# Patient Record
Sex: Female | Born: 1980 | Race: White | Hispanic: No | Smoking: Current every day smoker
Health system: Southern US, Community
[De-identification: ages and names within clinical notes are randomized; demographics above are authoritative.]

---

## 2000-11-08 ENCOUNTER — Other Ambulatory Visit: Admission: RE | Admit: 2000-11-08 | Discharge: 2000-11-08 | Payer: Self-pay

## 2001-03-21 ENCOUNTER — Other Ambulatory Visit: Admission: RE | Admit: 2001-03-21 | Discharge: 2001-03-21 | Payer: Self-pay

## 2002-09-08 ENCOUNTER — Emergency Department (HOSPITAL_COMMUNITY): Admission: EM | Admit: 2002-09-08 | Discharge: 2002-09-08 | Payer: Self-pay | Admitting: Emergency Medicine

## 2002-09-10 ENCOUNTER — Ambulatory Visit (HOSPITAL_COMMUNITY): Admission: RE | Admit: 2002-09-10 | Discharge: 2002-09-10 | Payer: Self-pay | Admitting: Emergency Medicine

## 2002-09-10 ENCOUNTER — Encounter: Payer: Self-pay | Admitting: Emergency Medicine

## 2003-04-04 ENCOUNTER — Emergency Department (HOSPITAL_COMMUNITY): Admission: EM | Admit: 2003-04-04 | Discharge: 2003-04-04 | Payer: Self-pay | Admitting: Emergency Medicine

## 2004-12-17 ENCOUNTER — Emergency Department (HOSPITAL_COMMUNITY): Admission: EM | Admit: 2004-12-17 | Discharge: 2004-12-17 | Payer: Self-pay | Admitting: Emergency Medicine

## 2005-01-29 ENCOUNTER — Ambulatory Visit (HOSPITAL_COMMUNITY): Admission: RE | Admit: 2005-01-29 | Discharge: 2005-01-29 | Payer: Self-pay | Admitting: *Deleted

## 2005-03-15 ENCOUNTER — Ambulatory Visit (HOSPITAL_COMMUNITY): Admission: RE | Admit: 2005-03-15 | Discharge: 2005-03-15 | Payer: Self-pay | Admitting: *Deleted

## 2005-04-15 ENCOUNTER — Ambulatory Visit (HOSPITAL_COMMUNITY): Admission: RE | Admit: 2005-04-15 | Discharge: 2005-04-15 | Payer: Self-pay | Admitting: *Deleted

## 2005-04-23 ENCOUNTER — Ambulatory Visit (HOSPITAL_COMMUNITY): Admission: RE | Admit: 2005-04-23 | Discharge: 2005-04-23 | Payer: Self-pay | Admitting: *Deleted

## 2010-02-08 ENCOUNTER — Encounter: Payer: Self-pay | Admitting: *Deleted

## 2010-02-08 ENCOUNTER — Encounter: Payer: Self-pay | Admitting: Preventative Medicine

## 2012-07-11 ENCOUNTER — Encounter: Payer: Self-pay | Admitting: Cardiovascular Disease

## 2012-07-26 ENCOUNTER — Ambulatory Visit (HOSPITAL_COMMUNITY)
Admission: RE | Admit: 2012-07-26 | Discharge: 2012-07-26 | Disposition: A | Discharge status: Home / Self Care | Payer: Medicare Other | Source: Ambulatory Visit | Attending: Cardiovascular Disease | Admitting: Cardiovascular Disease

## 2012-07-26 DIAGNOSIS — G459 Transient cerebral ischemic attack, unspecified: Secondary | ICD-10-CM | POA: Insufficient documentation

## 2012-07-26 NOTE — Progress Notes (Signed)
*  PRELIMINARY RESULTS* Echocardiogram 2D Echo limited Bubble study for PFO has been performed.  Conrad Whittier 07/26/2012, 1:23 PM

## 2014-08-14 ENCOUNTER — Encounter: Payer: Self-pay | Admitting: Cardiovascular Disease

## 2016-06-05 ENCOUNTER — Encounter (HOSPITAL_COMMUNITY): Payer: Self-pay | Admitting: *Deleted

## 2016-06-05 ENCOUNTER — Emergency Department (HOSPITAL_COMMUNITY)
Admission: EM | Admit: 2016-06-05 | Discharge: 2016-06-05 | Disposition: A | Discharge status: Home / Self Care | Payer: Medicare Other | Attending: Emergency Medicine | Admitting: Emergency Medicine

## 2016-06-05 ENCOUNTER — Emergency Department (HOSPITAL_COMMUNITY): Payer: Medicare Other

## 2016-06-05 DIAGNOSIS — W19XXXA Unspecified fall, initial encounter: Secondary | ICD-10-CM

## 2016-06-05 DIAGNOSIS — Y9389 Activity, other specified: Secondary | ICD-10-CM | POA: Insufficient documentation

## 2016-06-05 DIAGNOSIS — F17210 Nicotine dependence, cigarettes, uncomplicated: Secondary | ICD-10-CM | POA: Insufficient documentation

## 2016-06-05 DIAGNOSIS — S6992XA Unspecified injury of left wrist, hand and finger(s), initial encounter: Secondary | ICD-10-CM | POA: Diagnosis present

## 2016-06-05 DIAGNOSIS — S52572A Other intraarticular fracture of lower end of left radius, initial encounter for closed fracture: Secondary | ICD-10-CM | POA: Diagnosis not present

## 2016-06-05 DIAGNOSIS — W1839XA Other fall on same level, initial encounter: Secondary | ICD-10-CM | POA: Insufficient documentation

## 2016-06-05 DIAGNOSIS — Y999 Unspecified external cause status: Secondary | ICD-10-CM | POA: Insufficient documentation

## 2016-06-05 DIAGNOSIS — Y929 Unspecified place or not applicable: Secondary | ICD-10-CM | POA: Diagnosis not present

## 2016-06-05 MED ORDER — OXYCODONE-ACETAMINOPHEN 5-325 MG PO TABS: 1.0000 | ORAL_TABLET | ORAL | 0 refills | Status: DC | PRN

## 2016-06-05 MED ORDER — IBUPROFEN 800 MG PO TABS: 800.0000 mg | ORAL_TABLET | Freq: Three times a day (TID) | ORAL | 0 refills | Status: DC | PRN

## 2016-06-05 MED ORDER — OXYCODONE-ACETAMINOPHEN 5-325 MG PO TABS
1.0000 | ORAL_TABLET | Freq: Once | ORAL | Status: AC
  Administered 2016-06-05: 1 via ORAL
  Filled 2016-06-05: qty 1

## 2016-06-05 NOTE — ED Notes (Signed)
Pt discharged from ED AVS and prescriptions reviewed with verbalized understandings.  Pt left with mother.

## 2016-06-05 NOTE — ED Notes (Signed)
Sugar tong splint applied to L forearm, pt tolerated well.  Pt educated on importance of vascular assessment to finger tips.  Pt able to teach back and instructed on how to remove splint if circulation compromised.

## 2016-06-05 NOTE — ED Triage Notes (Signed)
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

## 2016-06-05 NOTE — ED Provider Notes (Signed)
Emergency Department Provider Note  By signing my name below, I, Levon HedgerElizabeth Hall, attest that this documentation has been prepared under the direction and in the presence of Hanh Kertesz, Arlyss RepressJoshua G, MD . Electronically Signed: Levon HedgerElizabeth Hall, Scribe. 06/05/2016. 9:01 PM.   I have reviewed the triage vital signs and the nursing notes.  HISTORY  Chief Complaint Fall  HPI Patricia Maxwell is a left hand dominant 36 y.o. female who presents to the Emergency Department complaining of sudden onset, constant pain to her left wrist s/p mechanical fall today PTA. 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 She describes her left wrist pain as 10/10, sharp, throbbing pain that is exacerbated by movement. She reports associated swelling to the area. No OTC treatments tried for these symptoms PTA.  She denies any head injury, LOC, pain to left shoulder or elbow, weakness, or numbness.   History reviewed. No pertinent past medical history.  There are no active problems to display for this patient.   History reviewed. No pertinent surgical history.   Allergies Codeine and Red dye  Family History  Problem Relation Age of Onset  . Stroke Father   . Cancer Maternal Grandmother   . Heart disease Paternal Grandmother   . Cancer Paternal Grandfather   . Heart disease Maternal Grandfather     Social History Social History  Substance Use Topics  . Smoking status: Current Every Day Smoker    Packs/day: 0.50    Years: 15.00    Types: Cigarettes  . Smokeless tobacco: Never Used  . Alcohol use No    Review of Systems  Constitutional: No fever/chills Eyes: No visual changes. ENT: No sore throat. Cardiovascular: Denies chest pain. Respiratory: Denies shortness of breath. Gastrointestinal: No abdominal pain.  No nausea, no vomiting.  No diarrhea.  No constipation. Genitourinary: Negative for dysuria. Musculoskeletal: Positive for left wrist pain.  Negative for back pain. Skin: Negative for rash. Neurological: Negative for headaches, focal weakness or numbness.  10-point ROS otherwise negative.  ____________________________________________   PHYSICAL EXAM:  VITAL SIGNS: ED Triage Vitals  Enc Vitals Group     BP 06/05/16 2014 (!) 122/96     Pulse Rate 06/05/16 2014 71     Resp 06/05/16 2014 20     Temp 06/05/16 2014 98.2 F (36.8 C)     Temp Source 06/05/16 2014 Oral     SpO2 06/05/16 2014 100 %     Weight 06/05/16 2014 151 lb 5 oz (68.6 kg)     Height 06/05/16 2014 5\' 3"  (1.6 m)     Pain Score 06/05/16 2013 10   Constitutional: Alert and oriented. Well appearing and in no acute distress. Eyes: Conjunctivae are normal.  Head: Atraumatic. Nose: No congestion/rhinnorhea. Mouth/Throat: Mucous membranes are moist.  Oropharynx non-erythematous. Neck: No stridor.  Cardiovascular: Normal rate, regular rhythm. Good peripheral circulation. Grossly normal heart sounds.   Respiratory: Normal respiratory effort.  No retractions. Lungs CTAB. Gastrointestinal: Soft and nontender. No distention.  Musculoskeletal: No lower extremity tenderness nor edema. Tender left wrist with mild swelling. No overlying abrasion or laceration. Normal radial pulse and sensation in the left hand/forearm.  Neurologic:  Normal speech and language. No gross focal neurologic deficits are appreciated.  Skin:  Skin is warm, dry and intact. No rash noted.  ____________________________________________  RADIOLOGY  Dg Wrist Complete Left  Result Date: 06/05/2016 CLINICAL DATA:  Fall, left wrist pain EXAM: LEFT WRIST -  COMPLETE 3+ VIEW COMPARISON:  None. FINDINGS: Comminuted, intra-articular distal radial fracture, nondisplaced. Associated mild soft tissue swelling. IMPRESSION: Intra-articular distal radial fracture, as above. Electronically Signed   By: Charline Bills M.D.   On: 06/05/2016 20:49     ____________________________________________   PROCEDURES  Procedure(s) performed:   .Splint Application Date/Time: 06/06/2016 7:22 AM Performed by: Maia Plan Authorized by: Maia Plan   Consent:    Consent obtained:  Verbal   Consent given by:  Patient   Risks discussed:  Discoloration, numbness, pain and swelling   Alternatives discussed:  Alternative treatment Pre-procedure details:    Sensation:  Normal   Skin color:  Normal Procedure details:    Laterality:  Left   Location:  Wrist   Wrist:  L wrist   Strapping: no     Splint type:  Sugar tong   Supplies:  Plaster Post-procedure details:    Pain:  Improved   Sensation:  Normal   Skin color:  Normal   Patient tolerance of procedure:  Tolerated well, no immediate complications    ____________________________________________   INITIAL IMPRESSION / ASSESSMENT AND PLAN / ED COURSE  Pertinent labs & imaging results that were available during my care of the patient were reviewed by me and considered in my medical decision making (see chart for details).  Patient presents to the ED for evaluation of left wrist pain after FOOSH injury mechanism while skating. Patient with a non-displaced intra-articular distal radial fracture. Splinted as above. Spoke with Dr. Romeo Apple who will see the patient in the office for further f/u. Discussed splint care at home and importance of should ROM to prevent frozen shoulder. Provided pain medication and NSAIDs.  At this time, I do not feel there is any life-threatening condition present. I have reviewed and discussed all results (EKG, imaging, lab, urine as appropriate), exam findings with patient. I have reviewed nursing notes and appropriate previous records.  I feel the patient is safe to be discharged home without further emergent workup. Discussed usual and customary return precautions. Patient and family (if present) verbalize understanding and are comfortable with this  plan.  Patient will follow-up with their primary care provider. If they do not have a primary care provider, information for follow-up has been provided to them. All questions have been answered.  ____________________________________________  FINAL CLINICAL IMPRESSION(S) / ED DIAGNOSES  Final diagnoses:  Fall, initial encounter  Other closed intra-articular fracture of distal end of left radius, initial encounter     MEDICATIONS GIVEN DURING THIS VISIT:  Medications  oxyCODONE-acetaminophen (PERCOCET/ROXICET) 5-325 MG per tablet 1 tablet (1 tablet Oral Given 06/05/16 2112)     NEW OUTPATIENT MEDICATIONS STARTED DURING THIS VISIT:  Discharge Medication List as of 06/05/2016  9:20 PM    START taking these medications   Details  ibuprofen (ADVIL,MOTRIN) 800 MG tablet Take 1 tablet (800 mg total) by mouth every 8 (eight) hours as needed., Starting Sat 06/05/2016, Print    oxyCODONE-acetaminophen (PERCOCET/ROXICET) 5-325 MG tablet Take 1 tablet by mouth every 4 (four) hours as needed for severe pain., Starting Sat 06/05/2016, Print       I personally performed the services described in this documentation, which was scribed in my presence. The recorded information has been reviewed and is accurate.    Note:  This document was prepared using Dragon voice recognition software and may include unintentional dictation errors.  Alona Bene, MD Emergency Medicine    Blossom Crume, Arlyss Repress, MD 06/06/16 [phone]

## 2016-06-05 NOTE — Discharge Instructions (Signed)
You were seen today with a wrist fracture. We are placing you in a splint which you should keep on and dry. Call the Orthopedic Physician Dr. Romeo AppleHarrison on Monday to schedule a follow up appointment. They will talk with you about management at that time. Take the Percocet only as needed for severe pain. Take Motrin and Tylenol for pain as well. The Percocet does contain some Tylenol so make sure that you do not take more than 4,000 mg in a 24 hour period.   Return to the ED with any additional injury, worsening pain, or numbness.

## 2016-06-07 ENCOUNTER — Ambulatory Visit (INDEPENDENT_AMBULATORY_CARE_PROVIDER_SITE_OTHER): Payer: Medicare Other | Admitting: Orthopedic Surgery

## 2016-06-07 ENCOUNTER — Encounter: Payer: Self-pay | Admitting: Orthopedic Surgery

## 2016-06-07 VITALS — BP 113/60 | HR 85 | Wt 153.0 lb

## 2016-06-07 DIAGNOSIS — S52532A Colles' fracture of left radius, initial encounter for closed fracture: Secondary | ICD-10-CM

## 2016-06-07 NOTE — Progress Notes (Signed)
  NEW PATIENT OFFICE VISIT    Chief Complaint  Patient presents with  . Wrist Injury    LEFT WRIST FRACTURE, DOI 06/05/16    36 year old female who is a smoker presents with no family history of osteoporosis but a 2 day history of new onset sharp pain over the left wrist after falling. She presented to the ER with pain swelling x-ray shows a distal radius fracture comminuted but nondisplaced. Injury was sustained skating    Review of Systems  Constitutional: Negative for chills, fever and weight loss.  Respiratory: Negative for shortness of breath.   Cardiovascular: Negative for chest pain.  Neurological: Negative for tingling.   Patient does not have hypertension or diabetes and she has never had surgery  Family History  Problem Relation Age of Onset  . Stroke Father   . Cancer Maternal Grandmother   . Heart disease Paternal Grandmother   . Cancer Paternal Grandfather   . Heart disease Maternal Grandfather    Social History  Substance Use Topics  . Smoking status: Current Every Day Smoker    Packs/day: 0.50    Years: 15.00    Types: Cigarettes  . Smokeless tobacco: Never Used  . Alcohol use No    BP 113/60   Pulse 85   Wt 153 lb (69.4 kg)   LMP 05/16/2016   BMI 27.10 kg/m   Physical Exam  Constitutional: She is oriented to person, place, and time. She appears well-developed and well-nourished. No distress.  Cardiovascular: Normal rate and intact distal pulses.   Neurological: She is alert and oriented to person, place, and time. She has normal reflexes. She exhibits normal muscle tone. Coordination normal.  Skin: Skin is warm and dry. No rash noted. She is not diaphoretic. No erythema. No pallor.  Psychiatric: She has a normal mood and affect. Her behavior is normal. Judgment and thought content normal.    Ortho Exam   Right upper extremity normal range of motion stability strength neurovascular exam intact skin normal no tenderness or malalignment  Left  distal radius tenderness swelling decreased range of motion stability deferred because of pain muscle tone normal elbow and shoulder normal range of motion no tenderness including the humerus and forearm elbow nontender  Neurovascular exam intact  Skin intact  Meds ordered this encounter  Medications  . citalopram (CELEXA) 20 MG tablet  . fluticasone (FLONASE) 50 MCG/ACT nasal spray  . omeprazole (PRILOSEC) 20 MG capsule    Encounter Diagnosis  Name Primary?  . Closed Colles' fracture of left radius, initial encounter Yes    X-ray shows a distal radius fracture slight dorsal tilt minimal displacement dorsally. Radial inclination slightly decreased but ulna is still shorter than the radius. Most the comminution is in the metaphyseal area there is intra-articular extension into the lunate fossa PLAN:    I believe this can be treated nonoperatively with a cast  X-ray 4 weeks out of plaster  Short arm cast applied

## 2016-07-05 ENCOUNTER — Ambulatory Visit (INDEPENDENT_AMBULATORY_CARE_PROVIDER_SITE_OTHER): Payer: Medicare Other | Admitting: Orthopedic Surgery

## 2016-07-05 ENCOUNTER — Encounter: Payer: Self-pay | Admitting: Orthopedic Surgery

## 2016-07-05 ENCOUNTER — Ambulatory Visit (INDEPENDENT_AMBULATORY_CARE_PROVIDER_SITE_OTHER): Payer: Medicare Other

## 2016-07-05 DIAGNOSIS — S52532D Colles' fracture of left radius, subsequent encounter for closed fracture with routine healing: Secondary | ICD-10-CM

## 2016-07-05 NOTE — Progress Notes (Signed)
Patient ID: Patricia Maxwell, female   DOB: 07/09/1980, 36 y.o.   MRN: [id number]  Chief Complaint  Patient presents with  . Follow-up    LEFT WRIST FRACTURE, DOI 06/05/16    28 days post fracture left wrist treated with cast  Here for four-week x-ray  36 year old female who is a smoker presents with no family history of osteoporosis but a 2 day history of new onset sharp pain over the left wrist after falling. She presented to the ER with pain swelling x-ray shows a distal radius fracture comminuted but nondisplaced. Injury was sustained skating    ROS    LMP 06/14/2016 (Approximate)  Gen. appearance is normal grooming and hygiene normal Orientation to person place and time normal Mood normal   Ortho Exam Overall clinical alignment of the left wrist is normal there is some tenderness swelling and painful wrist motion  Her x-ray shows maintenance of position of the intra-articular fracture with slight radial inclination loss slight height loss volar tilt is about 3 dorsal  Reapplication short arm cast  A/P  Medical decision-making  Encounter Diagnosis  Name Primary?  . Closed Colles' fracture of left radius with routine healing, subsequent encounter Yes      X-ray out of plaster 4 weeks, left wrist   Patricia CanadaStanley Harrison, MD 07/05/2016 2:18 PM

## 2016-08-02 ENCOUNTER — Ambulatory Visit (INDEPENDENT_AMBULATORY_CARE_PROVIDER_SITE_OTHER): Payer: Self-pay | Admitting: Orthopedic Surgery

## 2016-08-02 ENCOUNTER — Encounter: Payer: Self-pay | Admitting: Orthopedic Surgery

## 2016-08-02 ENCOUNTER — Ambulatory Visit (INDEPENDENT_AMBULATORY_CARE_PROVIDER_SITE_OTHER): Payer: Medicare Other

## 2016-08-02 DIAGNOSIS — S52532D Colles' fracture of left radius, subsequent encounter for closed fracture with routine healing: Secondary | ICD-10-CM | POA: Diagnosis not present

## 2016-08-02 NOTE — Progress Notes (Signed)
Fracture care follow-up visit  Chief Complaint  Patient presents with  . Follow-up    left colles fracture, DOI 06/05/16    Post injury day #58  Initial treatment day #55  Left distal radius fracture treated with cast immobilization  Her skin is intact is a little dry from the casting she can move all her fingers including her thumb  She has some soreness at the wrist joint  The x-ray shows no shortening some slight dorsal tilting an intra-articular extension  Recommend fracture brace with 3 times a day exercises which I showed her how to do  Combat four-week check range of motion  Encounter Diagnosis  Name Primary?  . Closed Colles' fracture of left radius with routine healing, subsequent encounter Yes

## 2016-08-02 NOTE — Patient Instructions (Signed)
3 times a day remove the brace and do the exercises as instructed

## 2016-08-30 ENCOUNTER — Ambulatory Visit (INDEPENDENT_AMBULATORY_CARE_PROVIDER_SITE_OTHER): Payer: Medicare Other | Admitting: Orthopedic Surgery

## 2016-08-30 DIAGNOSIS — S52532D Colles' fracture of left radius, subsequent encounter for closed fracture with routine healing: Secondary | ICD-10-CM

## 2016-08-30 NOTE — Progress Notes (Signed)
Chief Complaint  Patient presents with  . Fracture    Left wrist Date of injury 06/07/16 approx 10 weeks s/p    She's here for recheck after her left distal radius fracture which was treated with cast immobilization  She does complain of an ulnar prominence  Her last x-ray was taken July 16  She did lose some radial length and angulation and for the most part neutral tilt with lunate fossa depression  She has no tenderness now she has some wrist stiffness primarily flexion. Pronation supination is pretty good  Recommend resume normal activities and remove brace Encounter Diagnosis  Name Primary?  . Closed Colles' fracture of left radius with routine healing, subsequent encounter Yes

## 2018-07-19 ENCOUNTER — Other Ambulatory Visit: Payer: Self-pay

## 2018-07-19 ENCOUNTER — Other Ambulatory Visit: Payer: Medicare Other

## 2018-07-19 DIAGNOSIS — Z20822 Contact with and (suspected) exposure to covid-19: Secondary | ICD-10-CM

## 2018-07-24 LAB — NOVEL CORONAVIRUS, NAA: SARS-CoV-2, NAA: NOT DETECTED

## 2018-09-22 ENCOUNTER — Emergency Department (HOSPITAL_COMMUNITY)
Admission: EM | Admit: 2018-09-22 | Discharge: 2018-09-22 | Disposition: A | Discharge status: Home / Self Care | Payer: Medicare Other | Attending: Emergency Medicine | Admitting: Emergency Medicine

## 2018-09-22 ENCOUNTER — Other Ambulatory Visit: Payer: Self-pay

## 2018-09-22 ENCOUNTER — Emergency Department (HOSPITAL_COMMUNITY): Payer: Medicare Other

## 2018-09-22 ENCOUNTER — Encounter (HOSPITAL_COMMUNITY): Payer: Self-pay

## 2018-09-22 DIAGNOSIS — Z79899 Other long term (current) drug therapy: Secondary | ICD-10-CM | POA: Diagnosis not present

## 2018-09-22 DIAGNOSIS — F17210 Nicotine dependence, cigarettes, uncomplicated: Secondary | ICD-10-CM | POA: Insufficient documentation

## 2018-09-22 DIAGNOSIS — Z20828 Contact with and (suspected) exposure to other viral communicable diseases: Secondary | ICD-10-CM | POA: Diagnosis not present

## 2018-09-22 DIAGNOSIS — J069 Acute upper respiratory infection, unspecified: Secondary | ICD-10-CM | POA: Insufficient documentation

## 2018-09-22 DIAGNOSIS — R05 Cough: Secondary | ICD-10-CM | POA: Diagnosis present

## 2018-09-22 MED ORDER — BENZONATATE 100 MG PO CAPS: 100.0000 mg | ORAL_CAPSULE | Freq: Three times a day (TID) | ORAL | 0 refills | Status: DC

## 2018-09-22 NOTE — ED Provider Notes (Signed)
[facility]ENCY DEPARTMENT Provider Note   CSN: [id number] Arrival date & time: 09/22/18  1443     History   Chief Complaint Chief Complaint  Patient presents with  . Cough    HPI Patricia Maxwell is a 38 y.o. female who presents for evaluation of cough that began yesterday.  She reports cough is productive of yellow mucus.  She has not noted any hemoptysis.  She she measured a temperature of 99.9.  She has not had any runny nose or congestion.  She states that she feels achy all over.  She does report that when she gets into a coughing fit, she has trouble catching her breath after.  She also reports some chest soreness with coughing but otherwise does not any chest pain.  She states that she has not taken any medications for her symptoms.  She does not have any history of COPD or asthma.  She does report that she smokes cigarettes.  She denies any travel or known COVID-19 exposure. She denies any OCP use, recent immobilization, prior history of DVT/PE, recent surgery, leg swelling, or long travel.  Patient denies any abdominal pain, nausea/vomiting.     The history is provided by the patient.    History reviewed. No pertinent past medical history.  There are no active problems to display for this patient.   History reviewed. No pertinent surgical history.   OB History   No obstetric history on file.      Home Medications    Prior to Admission medications   Medication Sig Start Date End Date Taking? Authorizing Provider  benzonatate (TESSALON) 100 MG capsule Take 1 capsule (100 mg total) by mouth every 8 (eight) hours. 09/22/18   Maxwell CaulLayden, Huma Imhoff A, PA-C  citalopram (CELEXA) 20 MG tablet  04/20/16   [provider]  fluticasone Aleda Grana(FLONASE) 50 MCG/ACT nasal spray  04/20/16   [provider]  omeprazole (PRILOSEC) 20 MG capsule  04/20/16   [provider]    Family History Family History  Problem Relation Age of Onset  . Stroke Father   . Cancer  Maternal Grandmother   . Heart disease Paternal Grandmother   . Cancer Paternal Grandfather   . Heart disease Maternal Grandfather     Social History Social History   Tobacco Use  . Smoking status: Current Every Day Smoker    Packs/day: 0.50    Years: 15.00    Pack years: 7.50    Types: Cigarettes  . Smokeless tobacco: Never Used  Substance Use Topics  . Alcohol use: No    Alcohol/week: 0.0 standard drinks  . Drug use: No     Allergies   Codeine and Red dye   Review of Systems Review of Systems  Constitutional: Negative for fever.  HENT: Negative for congestion.   Respiratory: Positive for cough.   Cardiovascular: Negative for chest pain.  Gastrointestinal: Negative for abdominal pain, nausea and vomiting.  Musculoskeletal: Positive for myalgias.  All other systems reviewed and are negative.    Physical Exam Updated Vital Signs BP 123/67 (BP Location: Right Arm)   Pulse 60   Temp 99.9 F (37.7 C) (Oral)   Resp 18   Ht 5\' 2"  (1.575 m)   Wt 77.1 kg   LMP 09/08/2018   SpO2 96%   BMI 31.09 kg/m   Physical Exam Vitals signs and nursing note reviewed.  Constitutional:      Appearance: She is well-developed.  HENT:     Head:  Normocephalic and atraumatic.  Eyes:     General: No scleral icterus.       Right eye: No discharge.        Left eye: No discharge.     Conjunctiva/sclera: Conjunctivae normal.  Pulmonary:     Effort: Pulmonary effort is normal.     Breath sounds: Normal breath sounds.     Comments: Lungs clear to auscultation bilaterally.  Symmetric chest rise.  No wheezing, rales, rhonchi.  No evidence of respiratory distress. Abdominal:     Comments: Abdomen is soft, non-distended, non-tender. No rigidity, No guarding. No peritoneal signs.  Skin:    General: Skin is warm and dry.  Neurological:     Mental Status: She is alert.  Psychiatric:        Speech: Speech normal.        Behavior: Behavior normal.      ED Treatments / Results   Labs (all labs ordered are listed, but only abnormal results are displayed) Labs Reviewed  SARS CORONAVIRUS 2 (TAT 6-24 HRS)    EKG None  Radiology Dg Chest Portable 1 View  Result Date: 09/22/2018 CLINICAL DATA:  Cough for the past 2 days. EXAM: PORTABLE CHEST 1 VIEW COMPARISON:  Report dated 10/10/2012. FINDINGS: Normal sized heart. Clear lungs. Ill-defined right heart border, compatible with previously questioned pectus excavatum. Normal appearing bones in the frontal projection. IMPRESSION: No acute abnormality. Electronically Signed   By: Claudie Revering M.D.   On: 09/22/2018 16:15    Procedures Procedures (including critical care time)  Medications Ordered in ED Medications - No data to display   Initial Impression / Assessment and Plan / ED Course  I have reviewed the triage vital signs and the nursing notes.  Pertinent labs & imaging results that were available during my care of the patient were reviewed by me and considered in my medical decision making (see chart for details).        38 year old female who presents for evaluation of cough that began yesterday as well as some myalgias.  No fever.  Reports some shortness of breath and chest soreness after coughing but none otherwise.  No known COVID-19 exposure.  Initial ED arrival, she is afebrile, nontoxic appearing, sitting comfortably on bed.  Vital signs are stable.  Lungs clear to auscultation without any difficulty.  Evidence of respiratory stress.  Consider viral URI versus COVID.  History/physical exam is not concerning for PE, ACS etiology.  Chest x-ray ordered at triage.  Chest x-ray reviewed.  No acute abnormality.  Discussed results with patient.  We will plan to send outpatient cover test.  At this time, patient is not hypoxic or tachycardic.  Patient can be recently discharged home.  Instructed her to quarantine herself until the cover test comes back. At this time, patient exhibits no emergent life-threatening  condition that require further evaluation in ED or admission. Patient had ample opportunity for questions and discussion. All patient's questions were answered with full understanding. Strict return precautions discussed. Patient expresses understanding and agreement to plan.   Patricia Maxwell was evaluated in Emergency Department on 09/22/2018 for the symptoms described in the history of present illness. She was evaluated in the context of the global COVID-19 pandemic, which necessitated consideration that the patient might be at risk for infection with the SARS-CoV-2 virus that causes COVID-19. Institutional protocols and algorithms that pertain to the evaluation of patients at risk for COVID-19 are in a state of rapid change based on information  released by regulatory bodies including the CDC and federal and state organizations. These policies and algorithms were followed during the patient's care in the ED.   Portions of this note were generated with Scientist, clinical (histocompatibility and immunogenetics). Dictation errors may occur despite best attempts at proofreading.   Final Clinical Impressions(s) / ED Diagnoses   Final diagnoses:  Viral URI with cough    ED Discharge Orders         Ordered    benzonatate (TESSALON) 100 MG capsule  Every 8 hours     09/22/18 1643           Rosana Hoes 09/22/18 2049    Benjiman Core, MD 09/22/18 2242

## 2018-09-22 NOTE — ED Notes (Signed)
Patient contacted to return to ED for nasal swab. Expressed understanding.

## 2018-09-22 NOTE — Discharge Instructions (Signed)
You have been tested today for COVID-19.  The results will take 24 to 48 hours to come back.  You can check online regarding results.  You should quarantine herself until the results come back.  If they are positive, you will need to quarantine for 2 weeks.  Make sure you drink plenty fluids and staying hydrated.  Take Tessalon Perles as directed.  Return the emergency department for any trouble breathing, chest pain or any other worsening or concerning symptoms.     Person Under Monitoring Name: Patricia Maxwell  [place] [address] [place] [id number]   Infection Prevention Recommendations for Individuals Confirmed to have, or Being Evaluated for, 2019 Novel Coronavirus (COVID-19) Infection Who Receive Care at Home  Individuals who are confirmed to have, or are being evaluated for, COVID-19 should follow the prevention steps below until a healthcare provider or local or state health department says they can return to normal activities.  Stay home except to get medical care You should restrict activities outside your home, except for getting medical care. Do not go to work, school, or public areas, and do not use public transportation or taxis.  Call ahead before visiting your doctor Before your medical appointment, call the healthcare provider and tell them that you have, or are being evaluated for, COVID-19 infection. This will help the healthcare providers office take steps to keep other people from getting infected. Ask your healthcare provider to call the local or state health department.  Monitor your symptoms Seek prompt medical attention if your illness is worsening (e.g., difficulty breathing). Before going to your medical appointment, call the healthcare provider and tell them that you have, or are being evaluated for, COVID-19 infection. Ask your healthcare provider to call the local or state health department.  Wear a facemask You should wear a facemask that  covers your nose and mouth when you are in the same room with other people and when you visit a healthcare provider. People who live with or visit you should also wear a facemask while they are in the same room with you.  Separate yourself from other people in your home As much as possible, you should stay in a different room from other people in your home. Also, you should use a separate bathroom, if available.  Avoid sharing household items You should not share dishes, drinking glasses, cups, eating utensils, towels, bedding, or other items with other people in your home. After using these items, you should wash them thoroughly with soap and water.  Cover your coughs and sneezes Cover your mouth and nose with a tissue when you cough or sneeze, or you can cough or sneeze into your sleeve. Throw used tissues in a lined trash can, and immediately wash your hands with soap and water for at least 20 seconds or use an alcohol-based hand rub.  Wash your [employer] Corporationhands Wash your hands often and thoroughly with soap and water for at least 20 seconds. You can use an alcohol-based hand sanitizer if soap and water are not available and if your hands are not visibly dirty. Avoid touching your eyes, nose, and mouth with unwashed hands.   Prevention Steps for Caregivers and Household Members of Individuals Confirmed to have, or Being Evaluated for, COVID-19 Infection Being Cared for in the Home  If you live with, or provide care at home for, a person confirmed to have, or being evaluated for, COVID-19 infection please follow these guidelines to prevent infection:  Follow healthcare providers  instructions Make sure that you understand and can help the patient follow any healthcare provider instructions for all care.  Provide for the patients basic needs You should help the patient with basic needs in the home and provide support for getting groceries, prescriptions, and other personal needs.  Monitor  the patients symptoms If they are getting sicker, call his or her medical provider and tell them that the patient has, or is being evaluated for, COVID-19 infection. This will help the healthcare providers office take steps to keep other people from getting infected. Ask the healthcare provider to call the local or state health department.  Limit the number of people who have contact with the patient  If possible, have only one caregiver for the patient.  Other household members should stay in another home or place of residence. If this is not possible, they should stay  in another room, or be separated from the patient as much as possible. Use a separate bathroom, if available.  Restrict visitors who do not have an essential need to be in the home.  Keep older adults, very young children, and other sick people away from the patient Keep older adults, very young children, and those who have compromised immune systems or chronic health conditions away from the patient. This includes people with chronic heart, lung, or kidney conditions, diabetes, and cancer.  Ensure good ventilation Make sure that shared spaces in the home have good air flow, such as from an air conditioner or an opened window, weather permitting.  Wash your hands often  Wash your hands often and thoroughly with soap and water for at least 20 seconds. You can use an alcohol based hand sanitizer if soap and water are not available and if your hands are not visibly dirty.  Avoid touching your eyes, nose, and mouth with unwashed hands.  Use disposable paper towels to dry your hands. If not available, use dedicated cloth towels and replace them when they become wet.  Wear a facemask and gloves  Wear a disposable facemask at all times in the room and gloves when you touch or have contact with the patients blood, body fluids, and/or secretions or excretions, such as sweat, saliva, sputum, nasal mucus, vomit, urine, or  feces.  Ensure the mask fits over your nose and mouth tightly, and do not touch it during use.  Throw out disposable facemasks and gloves after using them. Do not reuse.  Wash your hands immediately after removing your facemask and gloves.  If your personal clothing becomes contaminated, carefully remove clothing and launder. Wash your hands after handling contaminated clothing.  Place all used disposable facemasks, gloves, and other waste in a lined container before disposing them with other household waste.  Remove gloves and wash your hands immediately after handling these items.  Do not share dishes, glasses, or other household items with the patient  Avoid sharing household items. You should not share dishes, drinking glasses, cups, eating utensils, towels, bedding, or other items with a patient who is confirmed to have, or being evaluated for, COVID-19 infection.  After the person uses these items, you should wash them thoroughly with soap and water.  Wash laundry thoroughly  Immediately remove and wash clothes or bedding that have blood, body fluids, and/or secretions or excretions, such as sweat, saliva, sputum, nasal mucus, vomit, urine, or feces, on them.  Wear gloves when handling laundry from the patient.  Read and follow directions on labels of laundry or clothing items and  detergent. In general, wash and dry with the warmest temperatures recommended on the label.  Clean all areas the individual has used often  Clean all touchable surfaces, such as counters, tabletops, doorknobs, bathroom fixtures, toilets, phones, keyboards, tablets, and bedside tables, every day. Also, clean any surfaces that may have blood, body fluids, and/or secretions or excretions on them.  Wear gloves when cleaning surfaces the patient has come in contact with.  Use a diluted bleach solution (e.g., dilute bleach with 1 part bleach and 10 parts water) or a household disinfectant with a label that  says EPA-registered for coronaviruses. To make a bleach solution at home, add 1 tablespoon of bleach to 1 quart (4 cups) of water. For a larger supply, add  cup of bleach to 1 gallon (16 cups) of water.  Read labels of cleaning products and follow recommendations provided on product labels. Labels contain instructions for safe and effective use of the cleaning product including precautions you should take when applying the product, such as wearing gloves or eye protection and making sure you have good ventilation during use of the product.  Remove gloves and wash hands immediately after cleaning.  Monitor yourself for signs and symptoms of illness Caregivers and household members are considered close contacts, should monitor their health, and will be asked to limit movement outside of the home to the extent possible. Follow the monitoring steps for close contacts listed on the symptom monitoring form.   ? If you have additional questions, contact your local health department or call the epidemiologist on call at [phone] (available 24/7). ? This guidance is subject to change. For the most up-to-date guidance from [facility], please refer to their website: [email or web address]hu

## 2018-09-22 NOTE — ED Triage Notes (Signed)
Pt presents to ED with complaints of cough x 2 days. Pt states she has been coughing up yellow mucous. Pt denies fever.

## 2018-09-23 LAB — SARS CORONAVIRUS 2 (TAT 6-24 HRS): SARS Coronavirus 2: NEGATIVE

## 2018-09-28 ENCOUNTER — Telehealth: Payer: Self-pay | Admitting: Pediatrics

## 2018-09-28 NOTE — Telephone Encounter (Signed)
Negative COVID results given. Patient results "NOT Detected." Caller expressed understanding. ° °

## 2018-10-05 ENCOUNTER — Other Ambulatory Visit (HOSPITAL_COMMUNITY): Payer: Self-pay | Admitting: Internal Medicine

## 2018-10-05 DIAGNOSIS — Z1231 Encounter for screening mammogram for malignant neoplasm of breast: Secondary | ICD-10-CM

## 2018-12-27 ENCOUNTER — Ambulatory Visit (HOSPITAL_COMMUNITY)
Admission: RE | Admit: 2018-12-27 | Discharge: 2018-12-27 | Disposition: A | Discharge status: Home / Self Care | Payer: Medicare Other | Source: Ambulatory Visit | Attending: Urgent Care | Admitting: Urgent Care

## 2018-12-27 ENCOUNTER — Other Ambulatory Visit: Payer: Self-pay

## 2018-12-27 ENCOUNTER — Ambulatory Visit
Admission: EM | Admit: 2018-12-27 | Discharge: 2018-12-27 | Disposition: A | Discharge status: Home / Self Care | Payer: Medicare Other | Attending: Urgent Care | Admitting: Urgent Care

## 2018-12-27 DIAGNOSIS — M7989 Other specified soft tissue disorders: Secondary | ICD-10-CM | POA: Insufficient documentation

## 2018-12-27 DIAGNOSIS — M25571 Pain in right ankle and joints of right foot: Secondary | ICD-10-CM | POA: Diagnosis not present

## 2018-12-27 DIAGNOSIS — F17210 Nicotine dependence, cigarettes, uncomplicated: Secondary | ICD-10-CM

## 2018-12-27 DIAGNOSIS — M79661 Pain in right lower leg: Secondary | ICD-10-CM

## 2018-12-27 DIAGNOSIS — G8929 Other chronic pain: Secondary | ICD-10-CM | POA: Diagnosis not present

## 2018-12-27 DIAGNOSIS — M79671 Pain in right foot: Secondary | ICD-10-CM

## 2018-12-27 MED ORDER — NAPROXEN 500 MG PO TABS: 500.0000 mg | ORAL_TABLET | Freq: Two times a day (BID) | ORAL | 0 refills | Status: AC

## 2018-12-27 NOTE — ED Provider Notes (Addendum)
[place]     MRN: [id number] DOB: [birth year]  Subjective:   Patricia Maxwell is a 38 y.o. female presenting for 2 day hx of acute onset persistent moderate-severe right ankle pain without known trauma, fall.  Has tried Tylenol with temporary relief only.  Patient states that she has a history of foot pain near her callus but this is not bothering her now.  She is a heavy smoker, has 25+ pack years.  She is working on quitting smoking now.  Denies history of blood clot, PAD.  No current facility-administered medications for this encounter.   Current Outpatient Medications:  .  citalopram (CELEXA) 20 MG tablet, , Disp: , Rfl:  .  fluticasone (FLONASE) 50 MCG/ACT nasal spray, , Disp: , Rfl:  .  omeprazole (PRILOSEC) 20 MG capsule, , Disp: , Rfl:    Allergies  Allergen Reactions  . Codeine   . Red Dye     History reviewed. No pertinent past medical history.   History reviewed. No pertinent surgical history.  Family History  Problem Relation Age of Onset  . Hypertension Mother   . Stroke Father   . Cancer Maternal Grandmother   . Heart disease Paternal Grandmother   . Cancer Paternal Grandfather   . Heart disease Maternal Grandfather     Social History   Tobacco Use  . Smoking status: Current Every Day Smoker    Packs/day: 0.50    Years: 15.00    Pack years: 7.50    Types: Cigarettes  . Smokeless tobacco: Never Used  Substance Use Topics  . Alcohol use: No    Alcohol/week: 0.0 standard drinks  . Drug use: No    Review of Systems  Constitutional: Negative for fever and malaise/fatigue.  HENT: Negative for congestion, ear pain, sinus pain and sore throat.   Eyes: Negative for discharge and redness.  Respiratory: Negative for cough, hemoptysis, shortness of breath and wheezing.   Cardiovascular: Negative for chest pain.  Gastrointestinal: Negative for abdominal pain, diarrhea, nausea and vomiting.  Genitourinary: Negative for dysuria, flank pain and  hematuria.  Musculoskeletal: Positive for joint pain. Negative for myalgias.  Skin: Negative for rash.  Neurological: Negative for dizziness, weakness and headaches.  Psychiatric/Behavioral: Negative for depression and substance abuse.     Objective:   Vitals: BP 113/73   Pulse 62   Temp 97.7 F (36.5 C)   Resp 18   LMP 11/27/2018   SpO2 98%   Physical Exam Constitutional:      General: She is not in acute distress.    Appearance: Normal appearance. She is well-developed. She is not ill-appearing, toxic-appearing or diaphoretic.  HENT:     Head: Normocephalic and atraumatic.     Nose: Nose normal.     Mouth/Throat:     Mouth: Mucous membranes are moist.     Pharynx: Oropharynx is clear.  Eyes:     General: No scleral icterus.    Extraocular Movements: Extraocular movements intact.     Pupils: Pupils are equal, round, and reactive to light.  Cardiovascular:     Rate and Rhythm: Normal rate and regular rhythm.     Pulses: Normal pulses.     Heart sounds: Normal heart sounds. No murmur. No friction rub. No gallop.   Pulmonary:     Effort: Pulmonary effort is normal. No respiratory distress.     Breath sounds: Normal breath sounds. No stridor. No wheezing, rhonchi or rales.  Musculoskeletal:     Right  ankle: She exhibits swelling (1+) and abnormal pulse (faint PT, DP pulses). She exhibits normal range of motion, no ecchymosis and no deformity. Tenderness. Lateral malleolus, medial malleolus and AITFL tenderness found. No CF ligament, no posterior TFL, no head of 5th metatarsal and no proximal fibula tenderness found. Achilles tendon exhibits no pain, no defect and normal Thompson's test results.     Right lower leg: She exhibits tenderness. She exhibits no bony tenderness, no swelling, no deformity and no laceration. No edema.     Left lower leg: No edema.       Legs:       Feet:  Skin:    General: Skin is warm and dry.     Findings: No rash.  Neurological:      General: No focal deficit present.     Mental Status: She is alert and oriented to person, place, and time.  Psychiatric:        Mood and Affect: Mood normal.        Behavior: Behavior normal.        Thought Content: Thought content normal.        Judgment: Judgment normal.     Dg Ankle Complete Right  Result Date: 12/27/2018 CLINICAL DATA:  Right ankle pain and swelling beginning 2 days ago. EXAM: RIGHT ANKLE - COMPLETE 3+ VIEW COMPARISON:  None. FINDINGS: Medial greater than lateral malleolar soft tissue swelling is present. There is no underlying fracture. The ankle is located. Normalization is normal. IMPRESSION: 1. Soft tissue swelling over the medial and lateral malleoli without underlying fracture. 2. No underlying osseous abnormality. Electronically Signed   By: Marin Roberts M.D.   On: 12/27/2018 13:19    Assessment and Plan :   1. Acute right ankle pain   2. Right foot pain   3. Right calf pain   4. Smoking greater than 25 pack years     Suspect inflammatory process, possible claudication, PAD given her smoking history.  Placed an order for external x-ray of the ankle, ultrasound of the right calf.  Will use naproxen for now.  Patient is to continue efforts at quitting smoking.  Had extensive discussion about management of claudication.  Offered a note for work but the patient refused. Counseled patient on potential for adverse effects with medications prescribed/recommended today, ER and return-to-clinic precautions discussed, patient verbalized understanding.    UPDATE: Called patient to leave voice message regarding her x-ray results which were negative for fracture but showed soft tissue swelling of the medial and lateral malleolus.  Continue with treatment plan as discussed in clinic.  ER precautions reviewed.    Wallis Bamberg, PA-C 12/27/18 1345

## 2018-12-27 NOTE — ED Triage Notes (Signed)
Pt presents with complaints of right foot pain. Reports that she has been having pain in the bottom of her foot for over a year. Reports that she could have gotten glass stuck in her foot last year but is not sure. States that it was mildly numb yesterday. Denies any recent injury.

## 2019-06-07 ENCOUNTER — Encounter (HOSPITAL_COMMUNITY): Payer: Self-pay

## 2019-06-07 ENCOUNTER — Ambulatory Visit (HOSPITAL_COMMUNITY)
Admission: RE | Admit: 2019-06-07 | Discharge: 2019-06-07 | Disposition: A | Discharge status: Home / Self Care | Payer: Medicare Other | Source: Ambulatory Visit | Attending: Internal Medicine | Admitting: Internal Medicine

## 2019-06-07 ENCOUNTER — Other Ambulatory Visit: Payer: Self-pay

## 2019-06-07 DIAGNOSIS — Z1231 Encounter for screening mammogram for malignant neoplasm of breast: Secondary | ICD-10-CM

## 2019-06-20 ENCOUNTER — Other Ambulatory Visit (HOSPITAL_COMMUNITY): Payer: Self-pay | Admitting: Internal Medicine

## 2019-06-20 DIAGNOSIS — N6459 Other signs and symptoms in breast: Secondary | ICD-10-CM

## 2019-07-10 ENCOUNTER — Ambulatory Visit (HOSPITAL_COMMUNITY)
Admission: RE | Admit: 2019-07-10 | Discharge: 2019-07-10 | Disposition: A | Discharge status: Home / Self Care | Payer: Medicare Other | Source: Ambulatory Visit | Attending: Internal Medicine | Admitting: Internal Medicine

## 2019-07-10 ENCOUNTER — Other Ambulatory Visit: Payer: Self-pay

## 2019-07-10 DIAGNOSIS — N6459 Other signs and symptoms in breast: Secondary | ICD-10-CM

## 2019-11-05 ENCOUNTER — Other Ambulatory Visit: Payer: Self-pay

## 2019-11-05 ENCOUNTER — Ambulatory Visit
Admission: EM | Admit: 2019-11-05 | Discharge: 2019-11-05 | Disposition: A | Discharge status: Home / Self Care | Payer: Medicare Other | Attending: Emergency Medicine | Admitting: Emergency Medicine

## 2019-11-05 ENCOUNTER — Encounter: Payer: Self-pay | Admitting: Emergency Medicine

## 2019-11-05 DIAGNOSIS — J069 Acute upper respiratory infection, unspecified: Secondary | ICD-10-CM | POA: Diagnosis not present

## 2019-11-05 DIAGNOSIS — Z1152 Encounter for screening for COVID-19: Secondary | ICD-10-CM

## 2019-11-05 MED ORDER — PREDNISONE 10 MG PO TABS
20.0000 mg | ORAL_TABLET | Freq: Every day | ORAL | 0 refills | Status: DC
Start: 2019-11-05 — End: 2023-01-15

## 2019-11-05 MED ORDER — BENZONATATE 100 MG PO CAPS
100.0000 mg | ORAL_CAPSULE | Freq: Three times a day (TID) | ORAL | 0 refills | Status: DC
Start: 2019-11-05 — End: 2023-01-15

## 2019-11-05 MED ORDER — CETIRIZINE HCL 10 MG PO TABS: 10.0000 mg | ORAL_TABLET | Freq: Every day | ORAL | 0 refills | Status: AC

## 2019-11-05 MED ORDER — FLUTICASONE PROPIONATE 50 MCG/ACT NA SUSP: 1.0000 | Freq: Every day | NASAL | 0 refills | Status: DC

## 2019-11-05 NOTE — ED Triage Notes (Signed)
Patient states that she has lots of sinus congestion and some cough x 3 days.

## 2019-11-05 NOTE — ED Provider Notes (Signed)
[facility]   [id number] 11/05/19 Arrival Time: 1214   CC: COVID symptoms  SUBJECTIVE: History from: patient.  Patricia Maxwell is a 39 y.o. female presented to the urgent care for complaint of cough, nasal congestion for the past 3 days.  Denies sick exposure to COVID, flu or strep or common cold.  Denies recent travel.  Has tried OTC medication without relief.  Denies aggravating factors.  Denies previous symptoms in the past.   Denies fever, chills, fatigue, sinus pain, rhinorrhea, sore throat, SOB, wheezing, chest pain, nausea, changes in bowel or bladder habits.     ROS: As per HPI.  All other pertinent ROS negative.     History reviewed. No pertinent past medical history. History reviewed. No pertinent surgical history. Allergies  Allergen Reactions   Codeine    Red Dye    No current facility-administered medications on file prior to encounter.   Current Outpatient Medications on File Prior to Encounter  Medication Sig Dispense Refill   citalopram (CELEXA) 20 MG tablet      naproxen (NAPROSYN) 500 MG tablet Take 1 tablet (500 mg total) by mouth 2 (two) times daily. 30 tablet 0   omeprazole (PRILOSEC) 20 MG capsule      Social History   Socioeconomic History   Marital status: Married    Spouse name: Not on file   Number of children: Not on file   Years of education: Not on file   Highest education level: Not on file  Occupational History   Not on file  Tobacco Use   Smoking status: Current Every Day Smoker    Packs/day: 0.50    Years: 15.00    Pack years: 7.50    Types: Cigarettes   Smokeless tobacco: Never Used  Building services engineer Use: Former  Substance and Sexual Activity   Alcohol use: No    Alcohol/week: 0.0 standard drinks   Drug use: No   Sexual activity: Not on file  Other Topics Concern   Not on file  Social History Narrative   Not on file   Social Determinants of Health   Financial Resource Strain:    Difficulty of  Paying Living Expenses: Not on file  Food Insecurity:    Worried About Programme researcher, broadcasting/film/video in the Last Year: Not on file   [employer] of Food in the Last Year: Not on file  Transportation Needs:    Lack of Transportation (Medical): Not on file   Lack of Transportation (Non-Medical): Not on file  Physical Activity:    Days of Exercise per Week: Not on file   Minutes of Exercise per Session: Not on file  Stress:    Feeling of Stress : Not on file  Social Connections:    Frequency of Communication with Friends and Family: Not on file   Frequency of Social Gatherings with Friends and Family: Not on file   Attends Religious Services: Not on file   Active Member of Clubs or Organizations: Not on file   Attends Banker Meetings: Not on file   Marital Status: Not on file  Intimate Partner Violence:    Fear of Current or Ex-Partner: Not on file   Emotionally Abused: Not on file   Physically Abused: Not on file   Sexually Abused: Not on file   Family History  Problem Relation Age of Onset   Hypertension Mother    Stroke Father    Cancer Maternal Grandmother  Heart disease Paternal Grandmother    Cancer Paternal Grandfather    Heart disease Maternal Grandfather     OBJECTIVE:  Vitals:   11/05/19 1224  BP: 125/76  Pulse: 60  Resp: 16  Temp: 98.4 F (36.9 C)  SpO2: 97%     General appearance: alert; appears fatigued, but nontoxic; speaking in full sentences and tolerating own secretions HEENT: NCAT; Ears: EACs clear, TMs pearly gray; Eyes: PERRL.  EOM grossly intact. Sinuses: nontender; Nose: nares patent without rhinorrhea, Throat: oropharynx clear, tonsils non erythematous or enlarged, uvula midline  Neck: supple without LAD Lungs: unlabored respirations, symmetrical air entry; cough: moderate; no respiratory distress; CTAB Heart: regular rate and rhythm.  Radial pulses 2+ symmetrical bilaterally Skin: warm and dry Psychological: alert and  cooperative; normal mood and affect  LABS:  No results found for this or any previous visit (from the past 24 hour(s)).   ASSESSMENT & PLAN:  1. URI with cough and congestion   2. Encounter for screening for COVID-19     Meds ordered this encounter  Medications   predniSONE (DELTASONE) 10 MG tablet    Sig: Take 2 tablets (20 mg total) by mouth daily.    Dispense:  15 tablet    Refill:  0   benzonatate (TESSALON) 100 MG capsule    Sig: Take 1 capsule (100 mg total) by mouth every 8 (eight) hours.    Dispense:  30 capsule    Refill:  0   fluticasone (FLONASE) 50 MCG/ACT nasal spray    Sig: Place 1 spray into both nostrils daily for 14 days.    Dispense:  16 g    Refill:  0   cetirizine (ZYRTEC ALLERGY) 10 MG tablet    Sig: Take 1 tablet (10 mg total) by mouth daily.    Dispense:  30 tablet    Refill:  0    Discharge instructions  COVID testing ordered.  It will take between 2-7 days for test results.  Someone will contact you regarding abnormal results.    In the meantime: You should remain isolated in your home for 10 days from symptom onset AND greater than 24 hours after symptoms resolution (absence of fever without the use of fever-reducing medication and improvement in respiratory symptoms), whichever is longer Get plenty of rest and push fluids Tessalon Perles prescribed for cough Zyrtec for nasal congestion, runny nose, and/or sore throat Flonase for nasal congestion and runny nose Prednisone was prescribed Use medications daily for symptom relief Use OTC medications like ibuprofen or tylenol as needed fever or pain Call or go to the ED if you have any new or worsening symptoms such as fever, worsening cough, shortness of breath, chest tightness, chest pain, turning blue, changes in mental status, etc...   Reviewed expectations re: course of current medical issues. Questions answered. Outlined signs and symptoms indicating need for more acute  intervention. Patient verbalized understanding. After Visit Summary given.         Durward Parcel, FNP 11/05/19 1300

## 2019-11-05 NOTE — Discharge Instructions (Addendum)
COVID testing ordered.  It will take between 2-7 days for test results.  Someone will contact you regarding abnormal results.    In the meantime: You should remain isolated in your home for 10 days from symptom onset AND greater than 24  hours after symptoms resolution (absence of fever without the use of fever-reducing medication and improvement in respiratory symptoms), whichever is longer Get plenty of rest and push fluids Tessalon Perles prescribed for cough Zyrtec for nasal congestion, runny nose, and/or sore throat Flonase for nasal congestion and runny nose Prednisone was prescribed Use medications daily for symptom relief Use OTC medications like ibuprofen or tylenol as needed fever or pain Call or go to the ED if you have any new or worsening symptoms such as fever, worsening cough, shortness of breath, chest tightness, chest pain, turning blue, changes in mental status, etc...  

## 2019-11-06 LAB — NOVEL CORONAVIRUS, NAA: SARS-CoV-2, NAA: NOT DETECTED

## 2019-11-06 LAB — SARS-COV-2, NAA 2 DAY TAT

## 2020-11-18 ENCOUNTER — Encounter (HOSPITAL_COMMUNITY): Payer: Self-pay | Admitting: *Deleted

## 2020-11-18 ENCOUNTER — Emergency Department (HOSPITAL_COMMUNITY)
Admission: EM | Admit: 2020-11-18 | Discharge: 2020-11-18 | Disposition: A | Discharge status: Home / Self Care | Payer: Medicare Other | Attending: Emergency Medicine | Admitting: Emergency Medicine

## 2020-11-18 ENCOUNTER — Emergency Department (HOSPITAL_COMMUNITY): Payer: Medicare Other

## 2020-11-18 ENCOUNTER — Other Ambulatory Visit: Payer: Self-pay

## 2020-11-18 DIAGNOSIS — F17210 Nicotine dependence, cigarettes, uncomplicated: Secondary | ICD-10-CM | POA: Insufficient documentation

## 2020-11-18 DIAGNOSIS — M79671 Pain in right foot: Secondary | ICD-10-CM | POA: Diagnosis not present

## 2020-11-18 DIAGNOSIS — X58XXXA Exposure to other specified factors, initial encounter: Secondary | ICD-10-CM | POA: Diagnosis not present

## 2020-11-18 DIAGNOSIS — S93601A Unspecified sprain of right foot, initial encounter: Secondary | ICD-10-CM | POA: Diagnosis not present

## 2020-11-18 DIAGNOSIS — S99921A Unspecified injury of right foot, initial encounter: Secondary | ICD-10-CM | POA: Diagnosis present

## 2020-11-18 NOTE — Discharge Instructions (Signed)
Your x-ray does not show any fractures today.  Please read the attached information about sprains and follow-up with your primary care provider if your symptoms are not improving after a week.  It was a pleasure to meet you and I hope that you feel better.

## 2020-11-18 NOTE — ED Provider Notes (Signed)
[facility] EMERGENCY DEPARTMENT Provider Note   CSN: [id number] Arrival date & time: 11/18/20  1015     History Chief Complaint  Patient presents with   Foot Pain    Patricia Maxwell is a 40 y.o. female presenting with a complaint of right foot pain.  She says that this pain started yesterday however she continued to walk and now feels worse.  No known injury.  No numbness or tingling.  Locates the majority of her pain to the medial aspect of her foot.  No tenderness in her ankle.  History reviewed. No pertinent past medical history.  There are no problems to display for this patient.   Past Surgical History:  Procedure Laterality Date   CESAREAN SECTION       OB History   No obstetric history on file.     Family History  Problem Relation Age of Onset   Hypertension Mother    Stroke Father    Cancer Maternal Grandmother    Heart disease Paternal Grandmother    Cancer Paternal Grandfather    Heart disease Maternal Grandfather     Social History   Tobacco Use   Smoking status: Every Day    Packs/day: 1.00    Years: 15.00    Pack years: 15.00    Types: Cigarettes   Smokeless tobacco: Never  Vaping Use   Vaping Use: Former  Substance Use Topics   Alcohol use: No    Alcohol/week: 0.0 standard drinks   Drug use: No    Home Medications Prior to Admission medications   Medication Sig Start Date End Date Taking? Authorizing Provider  benzonatate (TESSALON) 100 MG capsule Take 1 capsule (100 mg total) by mouth every 8 (eight) hours. 11/05/19   Avegno, Zachery Dakins, FNP  cetirizine (ZYRTEC ALLERGY) 10 MG tablet Take 1 tablet (10 mg total) by mouth daily. 11/05/19   Avegno, Zachery Dakins, FNP  citalopram (CELEXA) 20 MG tablet  04/20/16   [provider]  fluticasone (FLONASE) 50 MCG/ACT nasal spray Place 1 spray into both nostrils daily for 14 days. 11/05/19 11/19/19  Avegno, Zachery Dakins, FNP  naproxen (NAPROSYN) 500 MG tablet Take 1 tablet (500 mg total) by mouth 2  (two) times daily. 12/27/18   Wallis Bamberg, PA-C  omeprazole (PRILOSEC) 20 MG capsule  04/20/16   [provider]  predniSONE (DELTASONE) 10 MG tablet Take 2 tablets (20 mg total) by mouth daily. 11/05/19   Avegno, Zachery Dakins, FNP    Allergies    Codeine and Red dye  Review of Systems   Review of Systems  Musculoskeletal:  Positive for gait problem and joint swelling.  Skin:  Negative for wound.  Neurological:  Negative for weakness and numbness.   Physical Exam Updated Vital Signs BP 127/75 (BP Location: Right Arm)   Pulse 62   Temp 97.6 F (36.4 C) (Oral)   Resp 20   Ht 5\' 3"  (1.6 m)   Wt 76.2 kg   SpO2 97%   BMI 29.76 kg/m   Physical Exam Vitals and nursing note reviewed.  Constitutional:      Appearance: Normal appearance.  HENT:     Head: Normocephalic and atraumatic.  Eyes:     General: No scleral icterus.    Conjunctiva/sclera: Conjunctivae normal.  Pulmonary:     Effort: Pulmonary effort is normal. No respiratory distress.  Musculoskeletal:     Comments: Full range of motion of ankle and foot.  Some swelling noted to the  medial aspect of the foot.  No lacerations.  No sign of injury.  No deformity.  Strong pulses.  Skin:    General: Skin is warm and dry.     Capillary Refill: Capillary refill takes less than 2 seconds.     Findings: No rash.  Neurological:     Mental Status: She is alert.  Psychiatric:        Mood and Affect: Mood normal.    ED Results / Procedures / Treatments   Labs (all labs ordered are listed, but only abnormal results are displayed) Labs Reviewed - No data to display  EKG None  Radiology DG Foot Complete Right  Result Date: 11/18/2020 CLINICAL DATA:  Acute right foot pain without known injury. EXAM: RIGHT FOOT COMPLETE - 3+ VIEW COMPARISON:  December 27, 2018. FINDINGS: There is no evidence of fracture or dislocation. There is no evidence of arthropathy or other focal bone abnormality. Soft tissues are unremarkable.  IMPRESSION: Negative. Electronically Signed   By: Lupita Raider M.D.   On: 11/18/2020 11:56    Procedures Procedures   Medications Ordered in ED Medications - No data to display  ED Course  I have reviewed the triage vital signs and the nursing notes.  Pertinent labs & imaging results that were available during my care of the patient were reviewed by me and considered in my medical decision making (see chart for details).    MDM Rules/Calculators/A&P Patient was unable to report an injury to her foot.  X-ray negative.  This is consistent with a sprain.  She will be discharged with an ankle brace and information about RICE therapy.  She is agreeable to the plan at this point.  Final Clinical Impression(s) / ED Diagnoses Final diagnoses:  Sprain of right foot, initial encounter    Rx / DC Orders Results and diagnoses were explained to the patient. Return precautions discussed in full. Patient had no additional questions and expressed complete understanding.     Saddie Benders, PA-C 11/18/20 1317    Vanetta Mulders, MD 11/19/20 [phone]

## 2020-11-18 NOTE — ED Triage Notes (Signed)
Pt c/o right foot pain that started yesterday. Unknown injury.

## 2021-01-17 IMAGING — US US BREAST*L* LIMITED INC AXILLA
1 series · 5 of 5 positions shown · non-contrast
Comparison: None.

CLINICAL DATA: 39-year-old female with a palpable area of concern
in the left breast.

EXAM:
DIGITAL DIAGNOSTIC BILATERAL MAMMOGRAM WITH TOMO AND CAD; ULTRASOUND
LEFT BREAST LIMITED
LEFT BREAST ULTRASOUND

[Series 1: us breast*left* limited inc axilla · 0.07mm/px · 5 of 5 slices shown]
[im 1/5]
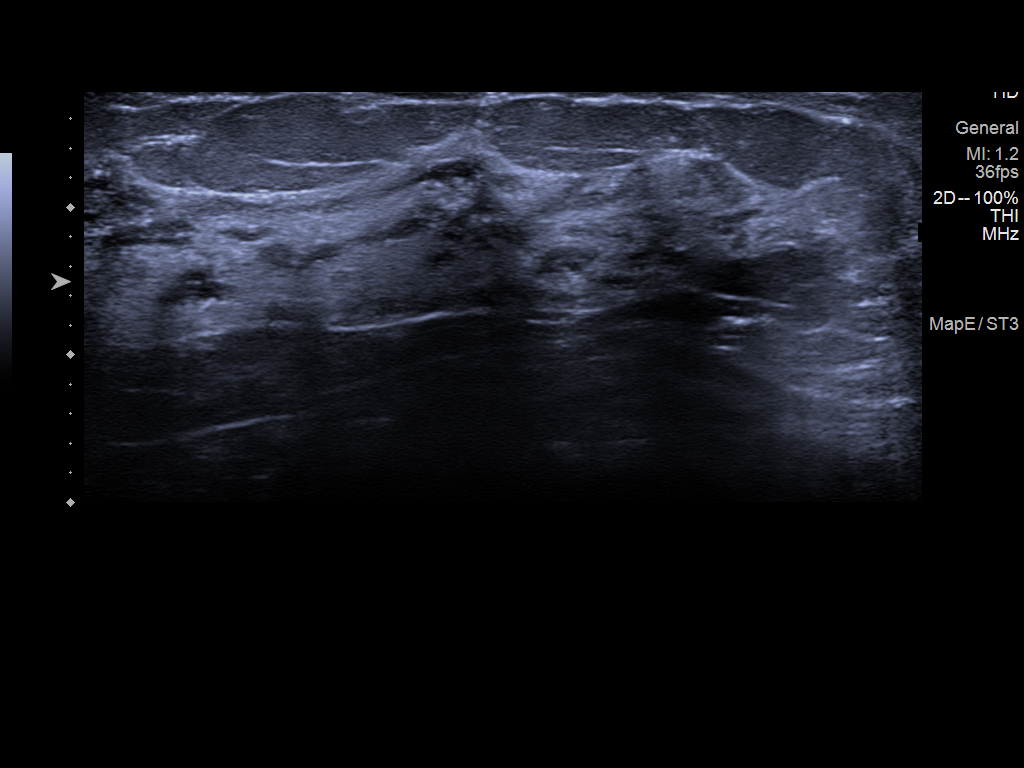
[im 2/5]
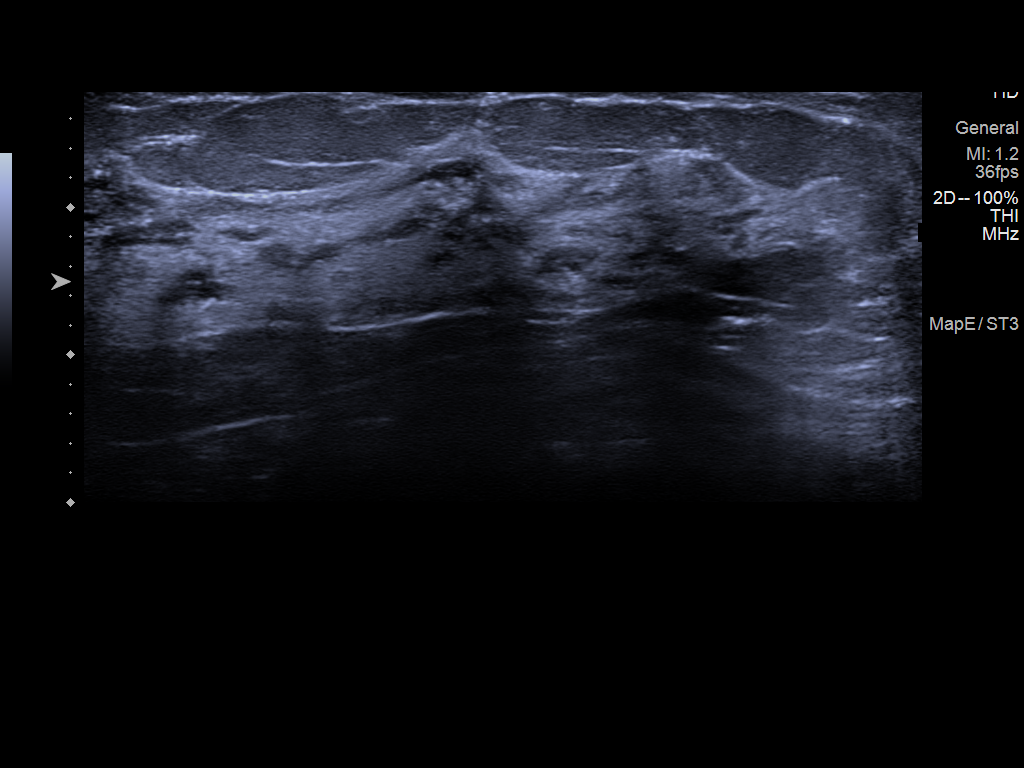
[im 3/5]
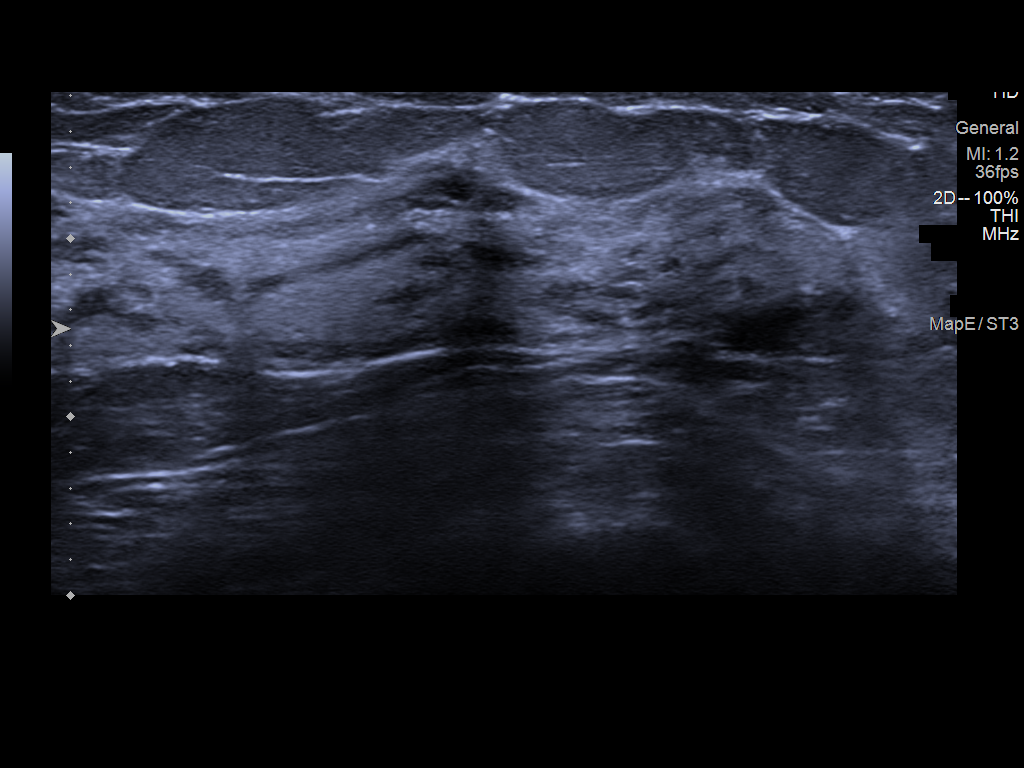
[im 4/5]
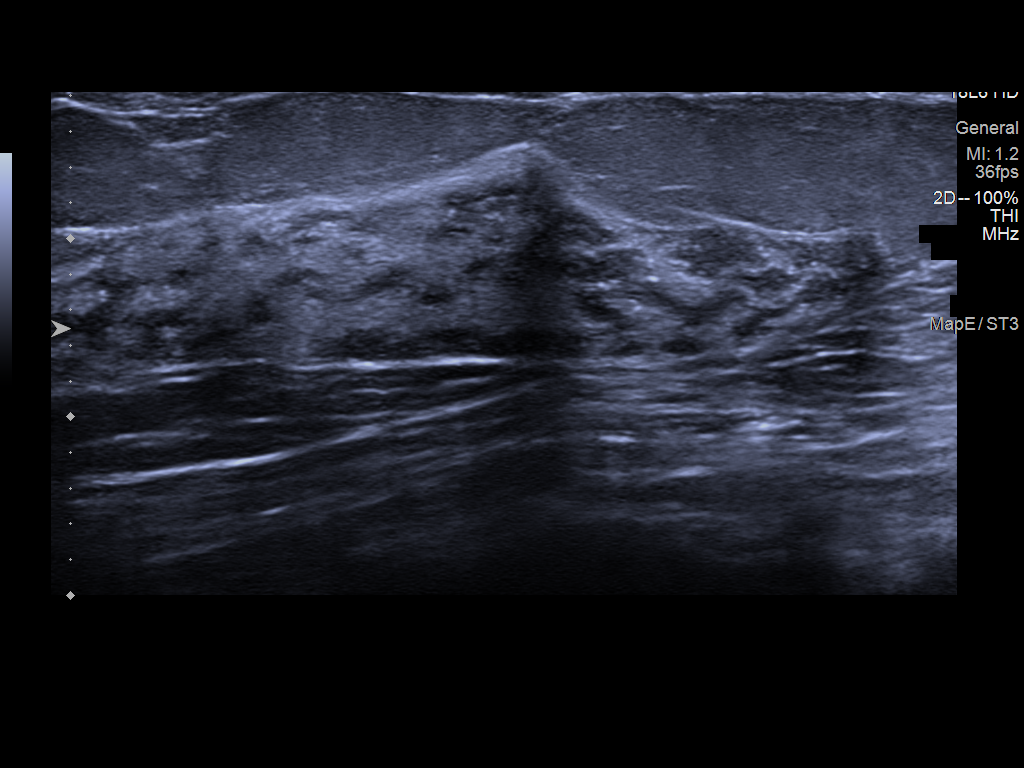
[im 5/5]
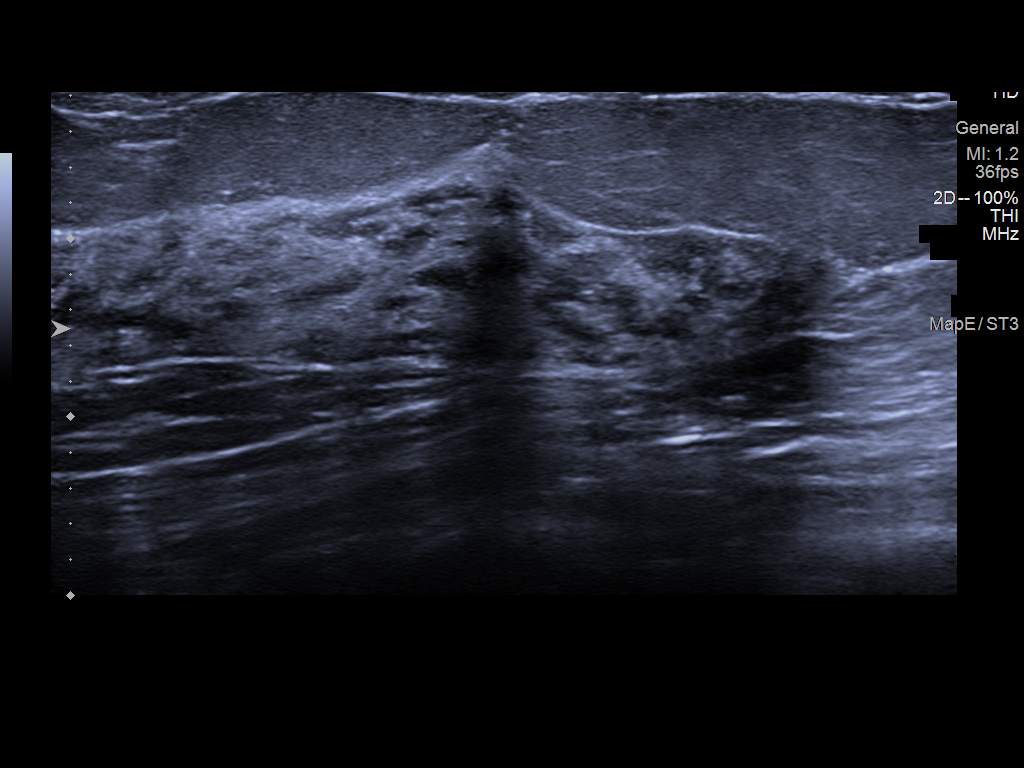

[5 of 5 positions shown; findings below may reference images not displayed]

ACR Breast Density Category d: The breast tissue is extremely dense,
which lowers the sensitivity of mammography.
FINDINGS: No suspicious masses or calcifications are seen in the left breast.
Spot compression tomograms were performed over the palpable area of
concern in the left breast no definite abnormality seen.

Mammographic images were processed with CAD.

Physical examination of the outer left breast at site of palpable
concern reveals an area of soft mobile thickening.

Targeted ultrasound of the outer left breast at site of palpable
concern was performed. No suspicious masses or abnormality seen,
only extremely dense fibroglandular tissue identified. This dense
fibroglandular tissue may account for the palpable area of concern
in this location.
IMPRESSION: 1. No mammographic or sonographic abnormalities the site of palpable
concern in the outer left breast. The dense fibroglandular tissue
may account for the palpable area of concern in this location.

2.  No mammographic evidence of malignancy in either breast.

RECOMMENDATION:
1. Recommend further management of the left breast palpable area of
concern be based on clinical assessment.

2.  Screening mammogram in one year.(Code:RP-T-TTN)

I have discussed the findings and recommendations with the patient.
If applicable, a reminder letter will be sent to the patient
regarding the next appointment.

BI-RADS CATEGORY  1: Negative.

## 2022-05-29 IMAGING — DX DG FOOT COMPLETE 3+V*R*
3 series · 3 of 3 positions shown · non-contrast
Comparison: December 27, 2018.

CLINICAL DATA: Acute right foot pain without known injury.

EXAM:
RIGHT FOOT COMPLETE - 3+ VIEW

[foot ap]
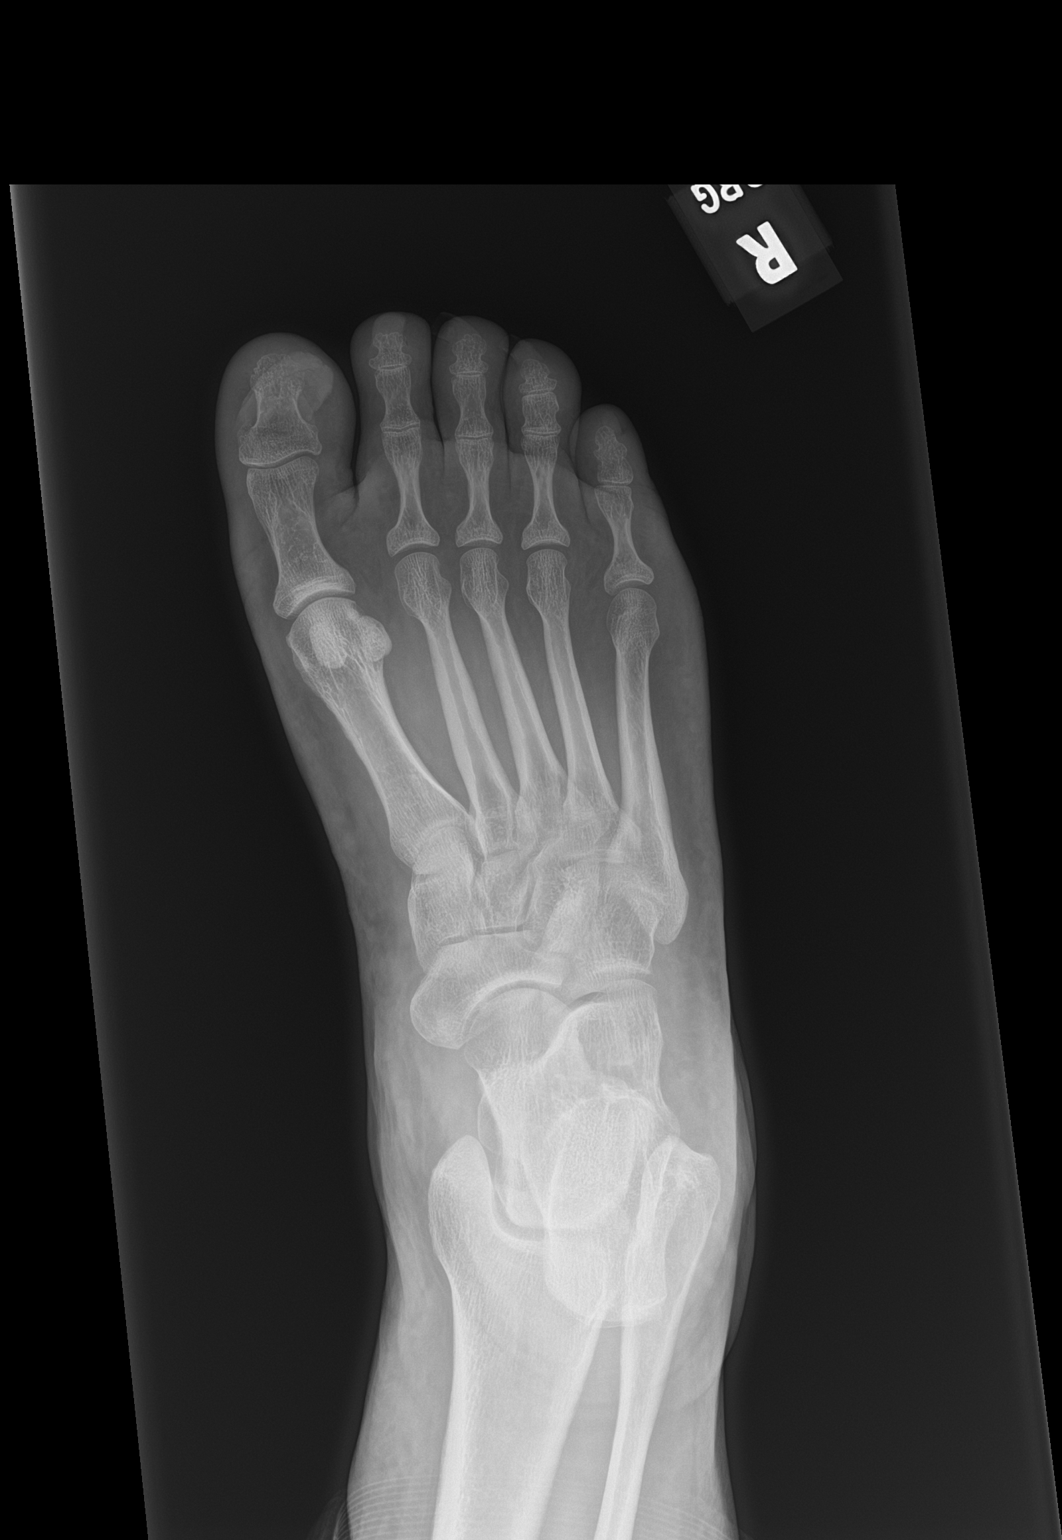

[foot obl]
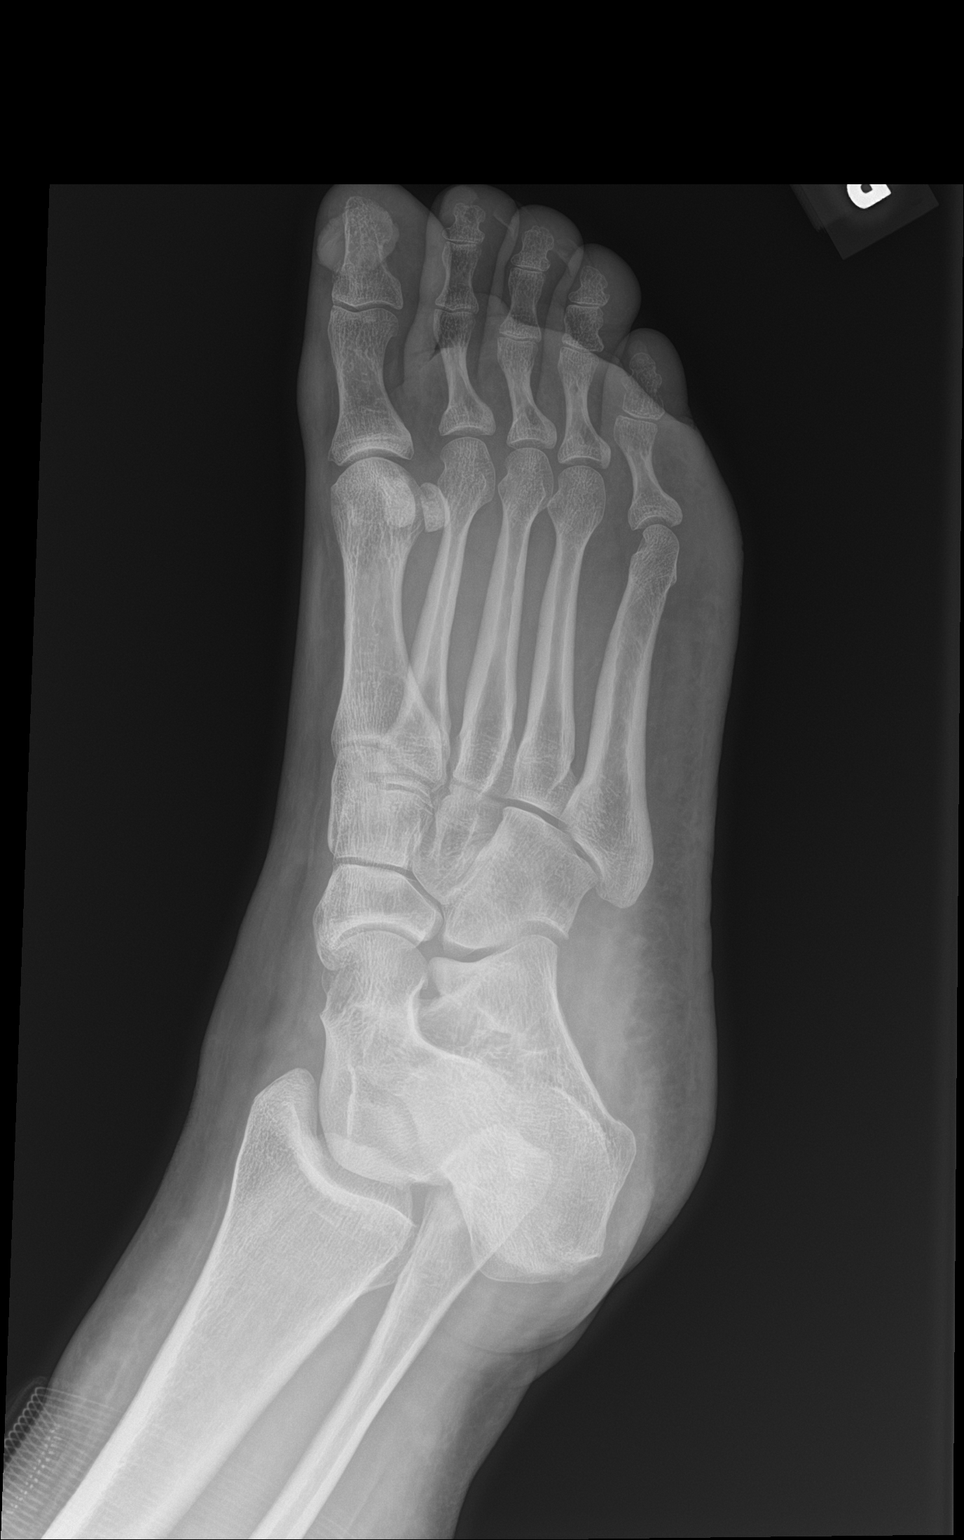

[foot lat]
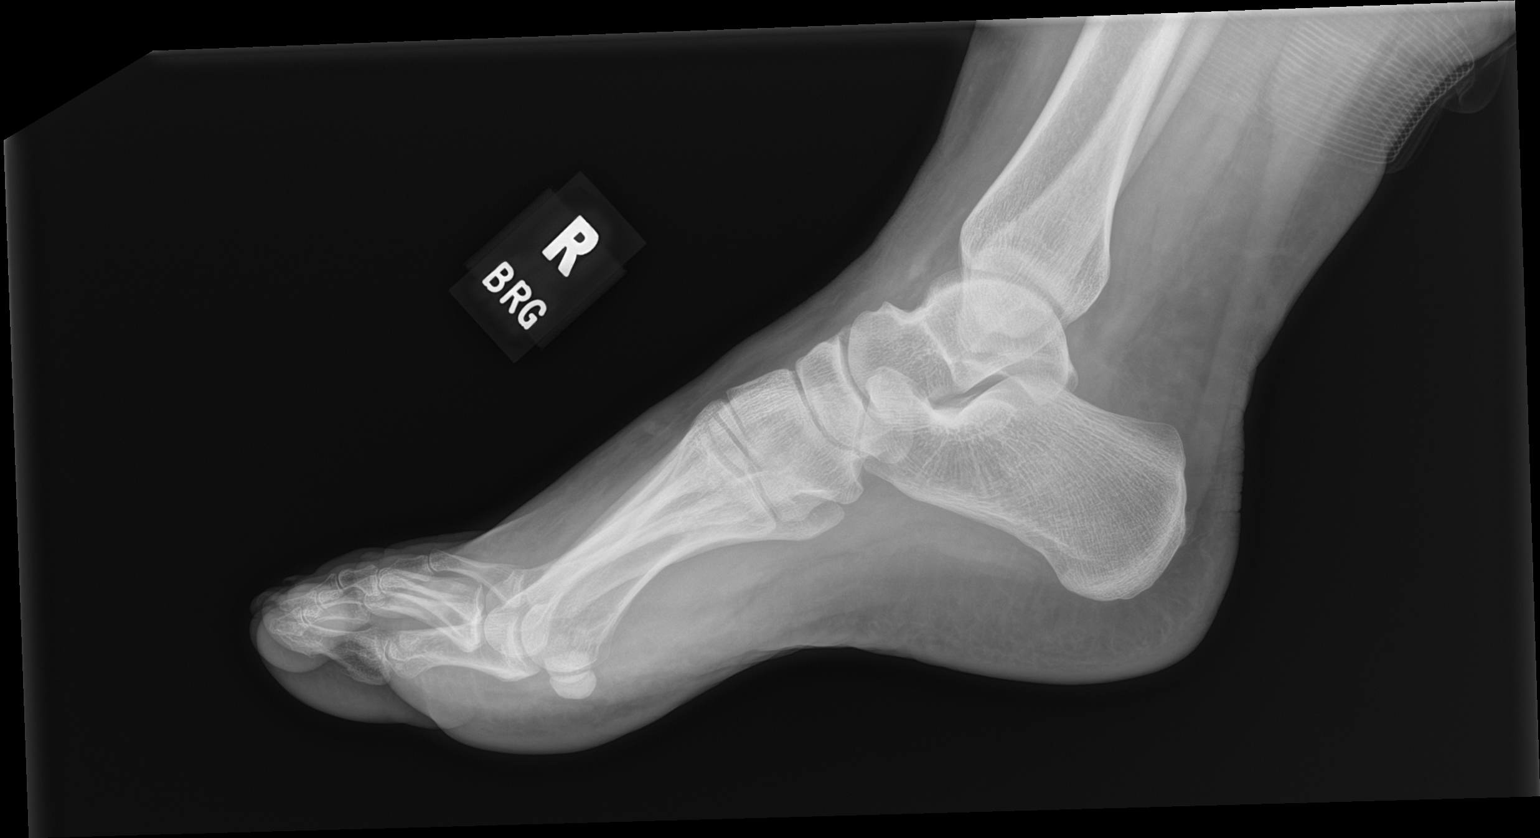

[3 of 3 positions shown; findings below may reference images not displayed]

FINDINGS: There is no evidence of fracture or dislocation. There is no
evidence of arthropathy or other focal bone abnormality. Soft
tissues are unremarkable.
IMPRESSION: Negative.

## 2022-10-06 ENCOUNTER — Other Ambulatory Visit (HOSPITAL_COMMUNITY): Payer: Self-pay | Admitting: Internal Medicine

## 2022-10-06 ENCOUNTER — Encounter (HOSPITAL_COMMUNITY): Payer: Self-pay | Admitting: Internal Medicine

## 2022-10-06 DIAGNOSIS — Z1231 Encounter for screening mammogram for malignant neoplasm of breast: Secondary | ICD-10-CM

## 2023-01-15 ENCOUNTER — Encounter: Payer: Self-pay | Admitting: Emergency Medicine

## 2023-01-15 ENCOUNTER — Ambulatory Visit (INDEPENDENT_AMBULATORY_CARE_PROVIDER_SITE_OTHER): Payer: Medicare Other

## 2023-01-15 ENCOUNTER — Ambulatory Visit
Admission: EM | Admit: 2023-01-15 | Discharge: 2023-01-15 | Disposition: A | Discharge status: Home / Self Care | Payer: Medicare Other | Attending: Family Medicine | Admitting: Family Medicine

## 2023-01-15 DIAGNOSIS — J180 Bronchopneumonia, unspecified organism: Secondary | ICD-10-CM

## 2023-01-15 DIAGNOSIS — R051 Acute cough: Secondary | ICD-10-CM

## 2023-01-15 LAB — POC COVID19/FLU A&B COMBO
Covid Antigen, POC: NEGATIVE
Influenza A Antigen, POC: NEGATIVE
Influenza B Antigen, POC: NEGATIVE

## 2023-01-15 MED ORDER — PREDNISONE 20 MG PO TABS: 40.0000 mg | ORAL_TABLET | Freq: Every day | ORAL | 0 refills | Status: DC

## 2023-01-15 MED ORDER — ALBUTEROL SULFATE HFA 108 (90 BASE) MCG/ACT IN AERS: 2.0000 | INHALATION_SPRAY | RESPIRATORY_TRACT | 0 refills | Status: AC | PRN

## 2023-01-15 MED ORDER — PROMETHAZINE-DM 6.25-15 MG/5ML PO SYRP: 5.0000 mL | ORAL_SOLUTION | Freq: Four times a day (QID) | ORAL | 0 refills | Status: DC | PRN

## 2023-01-15 MED ORDER — AMOXICILLIN-POT CLAVULANATE 875-125 MG PO TABS: 1.0000 | ORAL_TABLET | Freq: Two times a day (BID) | ORAL | 0 refills | Status: DC

## 2023-01-15 NOTE — ED Provider Notes (Signed)
[facility]    CSN: [id number] Arrival date & time: 01/15/23  3474      History   Chief Complaint No chief complaint on file.   HPI Patricia Maxwell is a 42 y.o. female.   Patient presenting today with 3-day history of progressively worsening cough, fever, chest tightness, shortness of breath.  Denies chest pain, abdominal pain, nausea vomiting or diarrhea.  Trying over-the-counter cold and congestion medication with minimal relief.  History of frequent bronchitis issues, not currently on an inhaler.    History reviewed. No pertinent past medical history.  There are no active problems to display for this patient.   Past Surgical History:  Procedure Laterality Date   CESAREAN SECTION      OB History   No obstetric history on file.      Home Medications    Prior to Admission medications   Medication Sig Start Date End Date Taking? Authorizing Provider  albuterol (VENTOLIN HFA) 108 (90 Base) MCG/ACT inhaler Inhale 2 puffs into the lungs every 4 (four) hours as needed. 01/15/23  Yes Particia Nearing, PA-C  amoxicillin-clavulanate (AUGMENTIN) 875-125 MG tablet Take 1 tablet by mouth every 12 (twelve) hours. 01/15/23  Yes Particia Nearing, PA-C  predniSONE (DELTASONE) 20 MG tablet Take 2 tablets (40 mg total) by mouth daily with breakfast. 01/15/23  Yes Particia Nearing, PA-C  promethazine-dextromethorphan (PROMETHAZINE-DM) 6.25-15 MG/5ML syrup Take 5 mLs by mouth 4 (four) times daily as needed. 01/15/23  Yes Particia Nearing, PA-C  cetirizine (ZYRTEC ALLERGY) 10 MG tablet Take 1 tablet (10 mg total) by mouth daily. 11/05/19   Avegno, Zachery Dakins, FNP  citalopram (CELEXA) 20 MG tablet  04/20/16   [provider]  naproxen (NAPROSYN) 500 MG tablet Take 1 tablet (500 mg total) by mouth 2 (two) times daily. 12/27/18   Wallis Bamberg, PA-C  omeprazole (PRILOSEC) 20 MG capsule  04/20/16   [provider]    Family History Family History   Problem Relation Age of Onset   Hypertension Mother    Stroke Father    Cancer Maternal Grandmother    Heart disease Paternal Grandmother    Cancer Paternal Grandfather    Heart disease Maternal Grandfather     Social History Social History   Tobacco Use   Smoking status: Every Day    Current packs/day: 1.00    Average packs/day: 1 pack/day for 15.0 years (15.0 ttl pk-yrs)    Types: Cigarettes   Smokeless tobacco: Never  Vaping Use   Vaping status: Former  Substance Use Topics   Alcohol use: No    Alcohol/week: 0.0 standard drinks of alcohol   Drug use: No     Allergies   Codeine and Red dye #40 (allura red)   Review of Systems Review of Systems Per HPI  Physical Exam Triage Vital Signs ED Triage Vitals  Encounter Vitals Group     BP 01/15/23 0920 108/72     Systolic BP Percentile --      Diastolic BP Percentile --      Pulse Rate 01/15/23 0920 100     Resp 01/15/23 0920 18     Temp 01/15/23 0920 98 F (36.7 C)     Temp Source 01/15/23 0920 Oral     SpO2 01/15/23 0920 94 %     Weight --      Height --      Head Circumference --      Peak Flow --  Pain Score 01/15/23 0921 0     Pain Loc --      Pain Education --      Exclude from Growth Chart --    No data found.  Updated Vital Signs BP 108/72 (BP Location: Right Arm)   Pulse 100   Temp 98 F (36.7 C) (Oral)   Resp 18   SpO2 94%   Visual Acuity Right Eye Distance:   Left Eye Distance:   Bilateral Distance:    Right Eye Near:   Left Eye Near:    Bilateral Near:     Physical Exam Vitals and nursing note reviewed.  Constitutional:      Appearance: Normal appearance.  HENT:     Head: Atraumatic.     Right Ear: Tympanic membrane and external ear normal.     Left Ear: Tympanic membrane and external ear normal.     Nose: Congestion present.     Mouth/Throat:     Mouth: Mucous membranes are moist.     Pharynx: Posterior oropharyngeal erythema present.  Eyes:     Extraocular  Movements: Extraocular movements intact.     Conjunctiva/sclera: Conjunctivae normal.  Cardiovascular:     Rate and Rhythm: Normal rate and regular rhythm.     Heart sounds: Normal heart sounds.  Pulmonary:     Effort: Pulmonary effort is normal.     Breath sounds: Wheezing and rales present.  Musculoskeletal:        General: Normal range of motion.     Cervical back: Normal range of motion and neck supple.  Skin:    General: Skin is warm and dry.  Neurological:     Mental Status: She is alert and oriented to person, place, and time.  Psychiatric:        Mood and Affect: Mood normal.        Thought Content: Thought content normal.      UC Treatments / Results  Labs (all labs ordered are listed, but only abnormal results are displayed) Labs Reviewed  POC COVID19/FLU A&B COMBO    EKG   Radiology DG Chest 2 View Result Date: 01/15/2023 CLINICAL DATA:  Productive cough, fever, and wheezing for 3 days. EXAM: CHEST - 2 VIEW COMPARISON:  None Available. FINDINGS: The heart size and mediastinal contours are within normal limits. Mild asymmetric streaky opacity is seen in the right upper and middle lobes, suspicious for bronchopneumonia. Left lung is clear. No pleural effusion. IMPRESSION: Mild asymmetric streaky opacity in right upper and middle lobes, suspicious for bronchopneumonia. Electronically Signed   By: Danae Orleans M.D.   On: 01/15/2023 11:22    Procedures Procedures (including critical care time)  Medications Ordered in UC Medications - No data to display  Initial Impression / Assessment and Plan / UC Course  I have reviewed the triage vital signs and the nursing notes.  Pertinent labs & imaging results that were available during my care of the patient were reviewed by me and considered in my medical decision making (see chart for details).     COVID and flu testing negative today, chest x-ray positive for bronchopneumonia.  Will treat with prednisone, Augmentin,  Phenergan DM, albuterol as needed and discuss support over-the-counter medications and home care.  Return for worsening symptoms.  Final Clinical Impressions(s) / UC Diagnoses   Final diagnoses:  Acute cough  Bronchopneumonia   Discharge Instructions   None    ED Prescriptions     Medication Sig Dispense Auth. Provider  predniSONE (DELTASONE) 20 MG tablet Take 2 tablets (40 mg total) by mouth daily with breakfast. 10 tablet Particia Nearing, PA-C   amoxicillin-clavulanate (AUGMENTIN) 875-125 MG tablet Take 1 tablet by mouth every 12 (twelve) hours. 14 tablet Particia Nearing, [place]   promethazine-dextromethorphan (PROMETHAZINE-DM) 6.25-15 MG/5ML syrup Take 5 mLs by mouth 4 (four) times daily as needed. 100 mL Particia Nearing, PA-C   albuterol (VENTOLIN HFA) 108 (90 Base) MCG/ACT inhaler Inhale 2 puffs into the lungs every 4 (four) hours as needed. 18 g Particia Nearing, [place]      PDMP not reviewed this encounter.   Particia Nearing, [place] 01/15/23 1141

## 2023-01-15 NOTE — ED Triage Notes (Signed)
Cough and fever since Christmas Day. Cough productive at times.

## 2023-01-26 ENCOUNTER — Ambulatory Visit (INDEPENDENT_AMBULATORY_CARE_PROVIDER_SITE_OTHER): Payer: Medicare Other

## 2023-01-26 ENCOUNTER — Ambulatory Visit
Admission: EM | Admit: 2023-01-26 | Discharge: 2023-01-26 | Disposition: A | Discharge status: Home / Self Care | Payer: Medicare Other | Attending: Nurse Practitioner | Admitting: Nurse Practitioner

## 2023-01-26 DIAGNOSIS — R059 Cough, unspecified: Secondary | ICD-10-CM

## 2023-01-26 DIAGNOSIS — J22 Unspecified acute lower respiratory infection: Secondary | ICD-10-CM | POA: Diagnosis not present

## 2023-01-26 MED ORDER — AMOXICILLIN-POT CLAVULANATE 875-125 MG PO TABS: 1.0000 | ORAL_TABLET | Freq: Two times a day (BID) | ORAL | 0 refills | Status: AC

## 2023-01-26 MED ORDER — PROMETHAZINE-DM 6.25-15 MG/5ML PO SYRP: 5.0000 mL | ORAL_SOLUTION | Freq: Four times a day (QID) | ORAL | 0 refills | Status: AC | PRN

## 2023-01-26 MED ORDER — PREDNISONE 20 MG PO TABS: 40.0000 mg | ORAL_TABLET | Freq: Every day | ORAL | 0 refills | Status: AC

## 2023-01-26 NOTE — Discharge Instructions (Signed)
 Your chest x-ray was negative for pneumonia.  I am treating you with a few more days of the antibiotic and prednisone .  I am also providing you additional cough medication for your cough at nighttime. Take medication as prescribed. Increase fluids and allow for plenty of rest. Recommend using a humidifier in your bedroom at nighttime during sleep and sleeping elevated on pillows while symptoms persist. Please be advised that your cough may persist for several days to weeks.  You may continue over-the-counter cough medications and increasing your fluids.  Recommend following up with your if you develop new symptoms such as fever, chills, shortness of breath, difficulty breathing, or other concerns. Follow-up as needed.

## 2023-01-26 NOTE — ED Triage Notes (Signed)
 Pt reports being seen on 01/15/2023, was diagnosed with bronchopneumonia, treated with albuterol Augmentin, prednisone and Promethazine DM. Pt states she has no gotten any better. Cough is still present she is able to cough up phlegm.

## 2023-01-26 NOTE — ED Provider Notes (Signed)
 [facility]    CSN: [id number] Arrival date & time: 01/26/23  9178      History   Chief Complaint No chief complaint on file.   HPI [name] is a 43 y.o. female.   The history is provided by the patient.   Patient presents for follow-up for continued cough, fatigue, chest tightness, wheezing, and upper back pain.  Patient was diagnosed with bronchopneumonia at that time, she was prescribed an albuterol  inhaler, Augmentin , and Promethazine  DM.  Patient denies new fever, chills, headache, ear pain, sore throat, difficulty breathing, chest pain, abdominal pain, nausea, vomiting, diarrhea, or rash.  Patient reports she has completed the medication, with minimal relief of her symptoms.  History reviewed. No pertinent past medical history.  There are no active problems to display for this patient.   Past Surgical History:  Procedure Laterality Date   CESAREAN SECTION      OB History   No obstetric history on file.      Home Medications    Prior to Admission medications   Medication Sig Start Date End Date Taking? Authorizing Provider  amoxicillin -clavulanate (AUGMENTIN ) 875-125 MG tablet Take 1 tablet by mouth 2 (two) times daily for 5 days. 01/26/23 01/31/23 Yes Leath-Warren, Etta PARAS, NP  predniSONE  (DELTASONE ) 20 MG tablet Take 2 tablets (40 mg total) by mouth daily with breakfast for 5 days. 01/26/23 01/31/23 Yes Leath-Warren, Etta PARAS, NP  promethazine -dextromethorphan (PROMETHAZINE -DM) 6.25-15 MG/5ML syrup Take 5 mLs by mouth 4 (four) times daily as needed. 01/26/23  Yes Leath-Warren, Etta PARAS, NP  albuterol  (VENTOLIN  HFA) 108 (90 Base) MCG/ACT inhaler Inhale 2 puffs into the lungs every 4 (four) hours as needed. 01/15/23   Stuart Vernell Norris, PA-C  cetirizine  (ZYRTEC  ALLERGY) 10 MG tablet Take 1 tablet (10 mg total) by mouth daily. 11/05/19   Avegno, Komlanvi S, FNP  citalopram (CELEXA) 20 MG tablet  04/20/16   [provider]  naproxen  (NAPROSYN )  500 MG tablet Take 1 tablet (500 mg total) by mouth 2 (two) times daily. 12/27/18   Christopher Savannah, PA-C  omeprazole (PRILOSEC) 20 MG capsule  04/20/16   [provider]    Family History Family History  Problem Relation Age of Onset   Hypertension Mother    Stroke Father    Cancer Maternal Grandmother    Heart disease Paternal Grandmother    Cancer Paternal Grandfather    Heart disease Maternal Grandfather     Social History Social History   Tobacco Use   Smoking status: Every Day    Current packs/day: 1.00    Average packs/day: 1 pack/day for 15.0 years (15.0 ttl pk-yrs)    Types: Cigarettes   Smokeless tobacco: Never  Vaping Use   Vaping status: Former  Substance Use Topics   Alcohol use: No    Alcohol/week: 0.0 standard drinks of alcohol   Drug use: No     Allergies   Codeine and Red dye #40 (allura red)   Review of Systems Review of Systems Per HPI  Physical Exam Triage Vital Signs ED Triage Vitals  Encounter Vitals Group     BP 01/26/23 0823 (!) 146/80     Systolic BP Percentile --      Diastolic BP Percentile --      Pulse Rate 01/26/23 0823 (!) 52     Resp 01/26/23 0823 17     Temp 01/26/23 0823 98 F (36.7 C)     Temp Source 01/26/23 0823 Oral  SpO2 01/26/23 0823 94 %     Weight --      Height --      Head Circumference --      Peak Flow --      Pain Score 01/26/23 0833 5     Pain Loc --      Pain Education --      Exclude from Growth Chart --    No data found.  Updated Vital Signs BP (!) 146/80 (BP Location: Right Arm)   Pulse (!) 52   Temp 98 F (36.7 C) (Oral)   Resp 17   SpO2 94%   Visual Acuity Right Eye Distance:   Left Eye Distance:   Bilateral Distance:    Right Eye Near:   Left Eye Near:    Bilateral Near:     Physical Exam Vitals and nursing note reviewed.  Constitutional:      General: She is not in acute distress.    Appearance: Normal appearance.  HENT:     Head: Normocephalic.     Right Ear: Tympanic  membrane, ear canal and external ear normal.     Left Ear: Tympanic membrane, ear canal and external ear normal.  Eyes:     Extraocular Movements: Extraocular movements intact.     Pupils: Pupils are equal, round, and reactive to light.  Cardiovascular:     Rate and Rhythm: Normal rate and regular rhythm.     Pulses: Normal pulses.     Heart sounds: Normal heart sounds.  Pulmonary:     Effort: Pulmonary effort is normal. No respiratory distress.     Breath sounds: No stridor. Rhonchi (clears with coughing) present. No wheezing or rales.  Abdominal:     General: Bowel sounds are normal.     Palpations: Abdomen is soft.     Tenderness: There is no abdominal tenderness.  Musculoskeletal:     Cervical back: Normal range of motion.  Lymphadenopathy:     Cervical: No cervical adenopathy.  Skin:    General: Skin is warm and dry.  Neurological:     General: No focal deficit present.     Mental Status: She is alert and oriented to person, place, and time.  Psychiatric:        Mood and Affect: Mood normal.        Behavior: Behavior normal.      UC Treatments / Results  Labs (all labs ordered are listed, but only abnormal results are displayed) Labs Reviewed - No data to display  EKG   Radiology DG Chest 2 View Result Date: 01/26/2023 CLINICAL DATA:  Cough. History of bronchopneumonia. Congestion and shortness of breath. EXAM: CHEST - 2 VIEW COMPARISON:  Chest radiograph dated 01/15/2023. FINDINGS: There is diffuse peribronchial cuffing which may represent reactive airway disease or viral infection. No focal consolidation, pleural effusion, or pneumothorax. The cardiac silhouette is within normal limits. No acute osseous pathology. IMPRESSION: No focal consolidation. Findings may represent reactive airway disease or viral infection. Electronically Signed   By: Vanetta Chou M.D.   On: 01/26/2023 09:17    Procedures Procedures (including critical care time)  Medications Ordered  in UC Medications - No data to display  Initial Impression / Assessment and Plan / UC Course  I have reviewed the triage vital signs and the nursing notes.  Pertinent labs & imaging results that were available during my care of the patient were reviewed by me and considered in my medical decision making (see chart  for details).  Chest x-ray is negative for pneumonia, it is suggestive of reactive airway disease or viral infection.  Patient with rhonchi heard on exam, but clears with cough.  Room air sats at 94% at this time, patient is in no acute distress..  Will provide additional empirical treatment with an additional 5 days of Augmentin  875/125 mg tablets,, and additional 5 days of prednisone  40 mg.  Promethazine  DM was also prescribed for cough..  Supportive care recommendations were provided and discussed with the patient to include fluids, rest, over-the-counter cough and cold medication, and use of a humidifier at nighttime during sleep.  Discussed indication for follow-up with the patient.  Patient was in agreement with this plan of care and verbalized understanding.  All questions were answered.  Patient stable for discharge.  Final Clinical Impressions(s) / UC Diagnoses   Final diagnoses:  Cough, unspecified type  Lower respiratory infection     Discharge Instructions      Your chest x-ray was negative for pneumonia.  I am treating you with a few more days of the antibiotic and prednisone .  I am also providing you additional cough medication for your cough at nighttime. Take medication as prescribed. Increase fluids and allow for plenty of rest. Recommend using a humidifier in your bedroom at nighttime during sleep and sleeping elevated on pillows while symptoms persist. Please be advised that your cough may persist for several days to weeks.  You may continue over-the-counter cough medications and increasing your fluids.  Recommend following up with your if you develop new  symptoms such as fever, chills, shortness of breath, difficulty breathing, or other concerns. Follow-up as needed.     ED Prescriptions     Medication Sig Dispense Auth. Provider   amoxicillin -clavulanate (AUGMENTIN ) 875-125 MG tablet Take 1 tablet by mouth 2 (two) times daily for 5 days. 10 tablet Leath-Warren, Etta PARAS, NP   predniSONE  (DELTASONE ) 20 MG tablet Take 2 tablets (40 mg total) by mouth daily with breakfast for 5 days. 10 tablet Leath-Warren, Etta PARAS, NP   promethazine -dextromethorphan (PROMETHAZINE -DM) 6.25-15 MG/5ML syrup Take 5 mLs by mouth 4 (four) times daily as needed. 118 mL Leath-Warren, Etta PARAS, NP      PDMP not reviewed this encounter.   Gilmer Etta PARAS, NP 01/26/23 [phone]

## 2023-05-02 ENCOUNTER — Ambulatory Visit (HOSPITAL_COMMUNITY)
Admission: RE | Admit: 2023-05-02 | Discharge: 2023-05-02 | Disposition: A | Discharge status: Home / Self Care | Source: Ambulatory Visit | Attending: Internal Medicine | Admitting: Internal Medicine

## 2023-05-02 DIAGNOSIS — Z1231 Encounter for screening mammogram for malignant neoplasm of breast: Secondary | ICD-10-CM | POA: Diagnosis present

## 2023-05-11 ENCOUNTER — Other Ambulatory Visit (HOSPITAL_COMMUNITY): Payer: Self-pay | Admitting: Internal Medicine

## 2023-05-11 DIAGNOSIS — R928 Other abnormal and inconclusive findings on diagnostic imaging of breast: Secondary | ICD-10-CM

## 2023-05-12 ENCOUNTER — Encounter (HOSPITAL_COMMUNITY): Payer: Self-pay

## 2023-05-12 ENCOUNTER — Ambulatory Visit (HOSPITAL_COMMUNITY)
Admission: RE | Admit: 2023-05-12 | Discharge: 2023-05-12 | Disposition: A | Discharge status: Home / Self Care | Source: Ambulatory Visit | Attending: Internal Medicine | Admitting: Internal Medicine

## 2023-05-12 ENCOUNTER — Ambulatory Visit (HOSPITAL_COMMUNITY)
Admission: RE | Admit: 2023-05-12 | Discharge: 2023-05-12 | Discharge status: Home / Self Care | Source: Ambulatory Visit | Attending: Internal Medicine

## 2023-05-12 DIAGNOSIS — R928 Other abnormal and inconclusive findings on diagnostic imaging of breast: Secondary | ICD-10-CM | POA: Insufficient documentation
